# Patient Record
Sex: Male | Born: 1995 | Race: Black or African American | Hispanic: No | Marital: Single
Health system: Southern US, Community
[De-identification: ages and names within clinical notes are randomized; demographics above are authoritative.]

## PROBLEM LIST (undated history)

## (undated) HISTORY — PX: APPENDECTOMY: SHX54

---
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
## 2017-08-15 ENCOUNTER — Encounter (HOSPITAL_COMMUNITY): Payer: Self-pay | Admitting: Family Medicine

## 2017-08-15 ENCOUNTER — Emergency Department (HOSPITAL_COMMUNITY)
Admission: EM | Admit: 2017-08-15 | Discharge: 2017-08-15 | Disposition: A | Discharge status: Home / Self Care | Payer: No Typology Code available for payment source | Attending: Emergency Medicine | Admitting: Emergency Medicine

## 2017-08-15 DIAGNOSIS — S80211A Abrasion, right knee, initial encounter: Secondary | ICD-10-CM | POA: Diagnosis not present

## 2017-08-15 DIAGNOSIS — Y999 Unspecified external cause status: Secondary | ICD-10-CM | POA: Diagnosis not present

## 2017-08-15 DIAGNOSIS — M25511 Pain in right shoulder: Secondary | ICD-10-CM | POA: Insufficient documentation

## 2017-08-15 DIAGNOSIS — M25561 Pain in right knee: Secondary | ICD-10-CM | POA: Insufficient documentation

## 2017-08-15 DIAGNOSIS — Y929 Unspecified place or not applicable: Secondary | ICD-10-CM | POA: Insufficient documentation

## 2017-08-15 DIAGNOSIS — Y939 Activity, unspecified: Secondary | ICD-10-CM | POA: Insufficient documentation

## 2017-08-15 DIAGNOSIS — Z041 Encounter for examination and observation following transport accident: Secondary | ICD-10-CM | POA: Diagnosis present

## 2017-08-15 DIAGNOSIS — F17210 Nicotine dependence, cigarettes, uncomplicated: Secondary | ICD-10-CM | POA: Insufficient documentation

## 2017-08-15 MED ORDER — METHOCARBAMOL 500 MG PO TABS: 500.0000 mg | ORAL_TABLET | Freq: Two times a day (BID) | ORAL | 0 refills | Status: AC
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
## 2017-08-15 NOTE — Discharge Instructions (Signed)
The pain your experiencing is likely due to muscle strain, you may take Ibuprofen and Robaxin as needed for pain management. Do not combine with any pain reliever other than tylenol. The muscle soreness should improve over the next week. Follow up with your family doctor in the next week for a recheck if you are still having symptoms. Return to ED if pain is worsening, you develop weakness or numbness of extremities, or new or concerning symptoms develop. °

## 2017-08-15 NOTE — ED Notes (Signed)
No answer when called for triage 

## 2017-08-15 NOTE — ED Notes (Signed)
No answer when called 
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
## 2018-04-08 ENCOUNTER — Emergency Department (HOSPITAL_COMMUNITY)
Admission: EM | Admit: 2018-04-08 | Discharge: 2018-04-08 | Disposition: A | Discharge status: Home / Self Care | Payer: Self-pay | Attending: Emergency Medicine | Admitting: Emergency Medicine

## 2018-04-08 ENCOUNTER — Other Ambulatory Visit: Payer: Self-pay

## 2018-04-08 ENCOUNTER — Encounter (HOSPITAL_COMMUNITY): Payer: Self-pay

## 2018-04-08 DIAGNOSIS — Z7982 Long term (current) use of aspirin: Secondary | ICD-10-CM | POA: Insufficient documentation

## 2018-04-08 DIAGNOSIS — R369 Urethral discharge, unspecified: Secondary | ICD-10-CM | POA: Insufficient documentation

## 2018-04-08 DIAGNOSIS — Z79899 Other long term (current) drug therapy: Secondary | ICD-10-CM | POA: Insufficient documentation

## 2018-04-08 DIAGNOSIS — Z202 Contact with and (suspected) exposure to infections with a predominantly sexual mode of transmission: Secondary | ICD-10-CM | POA: Insufficient documentation

## 2018-04-08 DIAGNOSIS — F17210 Nicotine dependence, cigarettes, uncomplicated: Secondary | ICD-10-CM | POA: Insufficient documentation

## 2018-04-08 MED ORDER — LIDOCAINE HCL (PF) 1 % IJ SOLN
INTRAMUSCULAR | Status: AC
  Administered 2018-04-08: 1 mL
  Filled 2018-04-08: qty 5

## 2018-04-08 MED ORDER — CEFTRIAXONE SODIUM 250 MG IJ SOLR
250.0000 mg | Freq: Once | INTRAMUSCULAR | Status: AC
  Administered 2018-04-08: 250 mg via INTRAMUSCULAR
  Filled 2018-04-08: qty 250

## 2018-04-08 MED ORDER — AZITHROMYCIN 250 MG PO TABS
1000.0000 mg | ORAL_TABLET | Freq: Once | ORAL | Status: AC
  Administered 2018-04-08: 1000 mg via ORAL
  Filled 2018-04-08: qty 4

## 2018-04-08 NOTE — ED Provider Notes (Signed)
[facility] EMERGENCY DEPARTMENT Provider Note   CSN: [id number] Arrival date & time: 04/08/18  1032     History   Chief Complaint Chief Complaint  Patient presents with  . Exposure to STD    HPI Jason Dodson is a 22 y.o. male.  The history is provided by the patient and medical records. No language interpreter was used.  Exposure to STD  Pertinent negatives include no abdominal pain.   Jason Dodson is a 22 y.o. male who presents the emergency department for exposure to STD.  Patient states that his girlfriend notified him that she tested positive for gonorrhea and thinks that he gave it to her.  He does endorse white penile discharge for couple of days.  Denies any fever, abdominal pain, nausea, vomiting.   History reviewed. No pertinent past medical history.  There are no active problems to display for this patient.   Past Surgical History:  Procedure Laterality Date  . APPENDECTOMY          Home Medications    Prior to Admission medications   Medication Sig Start Date End Date Taking? Authorizing Provider  acetaminophen (TYLENOL) 500 MG tablet Take 1,000 mg by mouth every 4 (four) hours as needed for moderate pain.    [provider]  Aspirin-Acetaminophen-Caffeine (GOODY HEADACHE PO) Take 1 each by mouth daily as needed (headache).    [provider]  Hyprom-Naphaz-Polysorb-Zn Sulf (CLEAR EYES COMPLETE OP) Apply 3 drops to eye daily as needed (dry eyes.).    [provider]  ibuprofen (ADVIL,MOTRIN) 800 MG tablet Take 1 tablet (800 mg total) by mouth 3 (three) times daily. 03/29/14   Gilda Crease, MD  methocarbamol (ROBAXIN) 500 MG tablet Take 1 tablet (500 mg total) by mouth 2 (two) times daily. 08/15/17   Dartha Lodge, PA-C    Family History History reviewed. No pertinent family history.  Social History Social History   Tobacco Use  . Smoking status: Current Every Day Smoker    Packs/day: 0.10   Types: Cigarettes  . Smokeless tobacco: Never Used  Substance Use Topics  . Alcohol use: No  . Drug use: No    Types: Marijuana    Comment: Prior history but dening today.      Allergies   Patient has no known allergies.   Review of Systems Review of Systems  Constitutional: Negative for fever.  Gastrointestinal: Negative for abdominal pain, nausea and vomiting.  Genitourinary: Positive for discharge. Negative for difficulty urinating, dysuria, penile pain, scrotal swelling and testicular pain.     Physical Exam Updated Vital Signs BP (!) 159/81 (BP Location: Right Arm)   Pulse 79   Temp 97.8 F (36.6 C) (Oral)   Resp 18   Ht 6' (1.829 m)   Wt 104.3 kg   SpO2 98%   BMI 31.19 kg/m   Physical Exam  Constitutional: He appears well-developed and well-nourished. No distress.  HENT:  Head: Normocephalic and atraumatic.  Neck: Neck supple.  Cardiovascular: Normal rate, regular rhythm and normal heart sounds.  No murmur heard. Pulmonary/Chest: Effort normal and breath sounds normal. No respiratory distress. He has no wheezes. He has no rales.  Abdominal: Soft. He exhibits no distension. There is no tenderness.  Genitourinary:  Genitourinary Comments: Chaperone present for exam. + discharge from penis. No signs of lesion or erythema on the penis or testicles. The penis and testicles are nontender. No testicular masses or swelling. No signs of any inguinal hernias. Cremaster  reflex present bilaterally.  Musculoskeletal: Normal range of motion.  Neurological: He is alert.  Skin: Skin is warm and dry.  Nursing note and vitals reviewed.    ED Treatments / Results  Labs (all labs ordered are listed, but only abnormal results are displayed) Labs Reviewed  GC/CHLAMYDIA PROBE AMP ([place]) NOT AT [facility]    EKG None  Radiology No results found.  Procedures Procedures (including critical care time)  Medications Ordered in ED Medications  cefTRIAXone (ROCEPHIN)  injection 250 mg (250 mg Intramuscular Given 04/08/18 1137)  azithromycin (ZITHROMAX) tablet 1,000 mg (1,000 mg Oral Given 04/08/18 1131)  lidocaine (PF) (XYLOCAINE) 1 % injection (1 mL  Given 04/08/18 1137)     Initial Impression / Assessment and Plan / ED Course  I have reviewed the triage vital signs and the nursing notes.  Pertinent labs & imaging results that were available during my care of the patient were reviewed by me and considered in my medical decision making (see chart for details).     Patient is a 22 y.o. male who presents to ED for penile discharge. He is afebrile without abdominal tenderness, abdominal pain or painful bowel movements to indicate prostatitis.  No tenderness to palpation of the testes or epididymis to suggest orchitis or epididymitis.  STD cultures obtained including gonorrhea and chlamydia. Patient to be discharged with instructions to follow up with health department. Discussed importance of using protection when sexually active. Patient understands that they have GC/Chlamydia cultures pending and that they will need to inform all sexual partners if results return positive. Patient has been treated prophylactically with azithromycin and Rocephin. Return precautions given and all questions answered.    Final Clinical Impressions(s) / ED Diagnoses   Final diagnoses:  STD exposure  Penile discharge    ED Discharge Orders    None       Alize Borrayo, Chase Picket, PA-C 04/08/18 1155    Eber Hong, MD 04/09/18 [phone]

## 2018-04-08 NOTE — ED Notes (Signed)
Patient verbalizes understanding of discharge instructions. Opportunity for questioning and answers were provided. Armband removed by staff, pt discharged from ED.  

## 2018-04-08 NOTE — Discharge Instructions (Signed)
Use a condom with every sexual encounter Follow up with the health department regards to today's visit.   Please return to the ER for fevers, vomiting, new or worsening symptoms, any additional concerns.   You have been tested for chlamydia and gonorrhea. These results will be available in approximately 3 days. You will be notified if they are positive.

## 2018-04-08 NOTE — ED Triage Notes (Signed)
Pt states a sexual partner told him she had gonorrhea. Pt states he has been having white discharge X2 days. No abdomiknal tenderness.

## 2018-04-09 LAB — GC/CHLAMYDIA PROBE AMP (~~LOC~~) NOT AT ARMC
Chlamydia: NEGATIVE
Neisseria Gonorrhea: POSITIVE — AB

## 2018-06-04 ENCOUNTER — Encounter (HOSPITAL_COMMUNITY): Payer: Self-pay

## 2018-06-04 ENCOUNTER — Emergency Department (HOSPITAL_COMMUNITY)
Admission: EM | Admit: 2018-06-04 | Discharge: 2018-06-04 | Disposition: A | Discharge status: Home / Self Care | Payer: Self-pay | Attending: Emergency Medicine | Admitting: Emergency Medicine

## 2018-06-04 DIAGNOSIS — R369 Urethral discharge, unspecified: Secondary | ICD-10-CM | POA: Insufficient documentation

## 2018-06-04 DIAGNOSIS — Z79899 Other long term (current) drug therapy: Secondary | ICD-10-CM | POA: Insufficient documentation

## 2018-06-04 DIAGNOSIS — R350 Frequency of micturition: Secondary | ICD-10-CM | POA: Insufficient documentation

## 2018-06-04 DIAGNOSIS — Z711 Person with feared health complaint in whom no diagnosis is made: Secondary | ICD-10-CM

## 2018-06-04 DIAGNOSIS — R300 Dysuria: Secondary | ICD-10-CM | POA: Insufficient documentation

## 2018-06-04 DIAGNOSIS — Z202 Contact with and (suspected) exposure to infections with a predominantly sexual mode of transmission: Secondary | ICD-10-CM | POA: Insufficient documentation

## 2018-06-04 DIAGNOSIS — F17210 Nicotine dependence, cigarettes, uncomplicated: Secondary | ICD-10-CM | POA: Insufficient documentation

## 2018-06-04 LAB — URINALYSIS, ROUTINE W REFLEX MICROSCOPIC
Bilirubin Urine: NEGATIVE
GLUCOSE, UA: NEGATIVE mg/dL
Hgb urine dipstick: NEGATIVE
Ketones, ur: NEGATIVE mg/dL
Leukocytes, UA: NEGATIVE
Nitrite: NEGATIVE
PROTEIN: NEGATIVE mg/dL
Specific Gravity, Urine: 1.025 (ref 1.005–1.030)
pH: 6 (ref 5.0–8.0)

## 2018-06-04 MED ORDER — LIDOCAINE HCL (PF) 1 % IJ SOLN
INTRAMUSCULAR | Status: AC
  Administered 2018-06-04: 0.9 mL
  Filled 2018-06-04: qty 5

## 2018-06-04 MED ORDER — CEFTRIAXONE SODIUM 250 MG IJ SOLR
250.0000 mg | Freq: Once | INTRAMUSCULAR | Status: AC
  Administered 2018-06-04: 250 mg via INTRAMUSCULAR
  Filled 2018-06-04: qty 250

## 2018-06-04 MED ORDER — AZITHROMYCIN 250 MG PO TABS
1000.0000 mg | ORAL_TABLET | Freq: Once | ORAL | Status: AC
  Administered 2018-06-04: 1000 mg via ORAL
  Filled 2018-06-04: qty 4

## 2018-06-04 NOTE — Discharge Instructions (Signed)
Follow up with [facility] Department STD clinic to be screened for HIV in the future and for future STD concerns or screenings. This is the recommendation by the CDC for people with multiple sexual partners or hx of STDs. You have been treated for gonorrhea and chlamydia in the ER but we did not get any samples to test for gonorrhea, chlamydia, syphilis, or HIV.  Do not have sexual intercourse for 7 days after antibiotic treatment.  Return to the emergency department if any concerning signs or symptoms develop such as fevers, persistent vomiting, pain with bowel movements, or abdominal pain.

## 2018-06-04 NOTE — ED Triage Notes (Signed)
Pt reports penile discharge for the past 3 days and penile irritation. Significant other having similar symptoms

## 2018-06-04 NOTE — ED Provider Notes (Signed)
[facility]   CSN: [id number] Arrival date & time: 06/04/18  1826     History   Chief Complaint Chief Complaint  Patient presents with  . SEXUALLY TRANSMITTED DISEASE    HPI Jason Dodson is a 23 y.o. male presents for evaluation of acute onset, persistent urethral discharge, dysuria, urinary frequency for 2 days.  He also notes some irritation to the tip of his penis.  Denies genital lesions, hematuria, abdominal pain, nausea, vomiting, pain with bowel movements, fevers, testicular pain or scrotal swelling.  Has not tried anything for his symptoms.  Reports he was recently treated for gonorrhea and thinks he may have a recurrence in his symptoms.  He has had new partners since he was last tested and has not always use protection.  He declines HIV or syphilis testing today.  His symptoms did resolve entirely until recurrence of symptoms 2 days ago.  The history is provided by the patient.    History reviewed. No pertinent past medical history.  There are no active problems to display for this patient.   Past Surgical History:  Procedure Laterality Date  . APPENDECTOMY          Home Medications    Prior to Admission medications   Medication Sig Start Date End Date Taking? Authorizing Provider  acetaminophen (TYLENOL) 500 MG tablet Take 1,000 mg by mouth every 4 (four) hours as needed for moderate pain.    [provider]  Aspirin-Acetaminophen-Caffeine (GOODY HEADACHE PO) Take 1 each by mouth daily as needed (headache).    [provider]  Hyprom-Naphaz-Polysorb-Zn Sulf (CLEAR EYES COMPLETE OP) Apply 3 drops to eye daily as needed (dry eyes.).    [provider]  ibuprofen (ADVIL,MOTRIN) 800 MG tablet Take 1 tablet (800 mg total) by mouth 3 (three) times daily. 03/29/14   Gilda CreasePollina, Christopher J, MD  methocarbamol (ROBAXIN) 500 MG tablet Take 1 tablet (500 mg total) by mouth 2 (two) times daily.  08/15/17   Dartha LodgeFord, Kelsey N, PA-C    Family History No family history on file.  Social History Social History   Tobacco Use  . Smoking status: Current Every Day Smoker    Packs/day: 0.10    Types: Cigarettes  . Smokeless tobacco: Never Used  Substance Use Topics  . Alcohol use: No  . Drug use: No    Types: Marijuana    Comment: Prior history but dening today.      Allergies   Patient has no known allergies.   Review of Systems Review of Systems  Constitutional: Negative for fever.  Gastrointestinal: Negative for abdominal pain, diarrhea, nausea, rectal pain and vomiting.  Genitourinary: Positive for discharge, dysuria and frequency. Negative for hematuria, penile pain, penile swelling, scrotal swelling and testicular pain.     Physical Exam Updated Vital Signs BP 133/75 (BP Location: Right Arm)   Pulse 67   Temp 97.7 F (36.5 C) (Oral)   Resp 14   SpO2 99%   Physical Exam Vitals signs and nursing note reviewed.  Constitutional:      General: He is not in acute distress.    Appearance: He is well-developed.     Comments: Resting comfortably in bed  HENT:     Head: Normocephalic and atraumatic.  Eyes:     General:        Right eye: No discharge.        Left eye: No discharge.     Conjunctiva/sclera: Conjunctivae normal.  Neck:     Vascular: No JVD.     Trachea: No tracheal deviation.  Cardiovascular:     Rate and Rhythm: Normal rate.     Heart sounds: Normal heart sounds.  Pulmonary:     Effort: Pulmonary effort is normal.     Breath sounds: Normal breath sounds.  Abdominal:     General: Abdomen is flat. There is no distension.     Tenderness: There is no abdominal tenderness. There is no right CVA tenderness, left CVA tenderness, guarding or rebound.  Genitourinary:    Comments: Patient refused Skin:    General: Skin is warm and dry.     Findings: No erythema.  Neurological:     Mental Status: He is alert.  Psychiatric:        Behavior: Behavior  normal.      ED Treatments / Results  Labs (all labs ordered are listed, but only abnormal results are displayed) Labs Reviewed  URINALYSIS, ROUTINE W REFLEX MICROSCOPIC  GC/CHLAMYDIA PROBE AMP ([place]) NOT AT [facility]    EKG None  Radiology No results found.  Procedures Procedures (including critical care time)  Medications Ordered in ED Medications  cefTRIAXone (ROCEPHIN) injection 250 mg (has no administration in time range)  azithromycin (ZITHROMAX) tablet 1,000 mg (has no administration in time range)     Initial Impression / Assessment and Plan / ED Course  I have reviewed the triage vital signs and the nursing notes.  Pertinent labs & imaging results that were available during my care of the patient were reviewed by me and considered in my medical decision making (see chart for details).     Patient presenting with urinary symptoms and urethral discharge.  He is afebrile, vital signs are stable.  Patient is afebrile without abdominal tenderness, abdominal pain or painful bowel movements to indicate prostatitis.  He refused GU examination or STD cultures so I am unable to assess for epididymitis or orchitis.  Based on history I have a low suspicion of testicular torsion.  His UA is unremarkable.  Patient to be discharged with instructions to follow up with PCP or the health department. Discussed importance of using protection when sexually active. Patient has been treated prophylactically with azithromycin and Rocephin.  Discussed strict ED return precautions. Pt verbalized understanding of and agreement with plan and is safe for discharge home at this time.     Final Clinical Impressions(s) / ED Diagnoses   Final diagnoses:  Concern about STD in male without diagnosis    ED Discharge Orders    None       [name] 06/04/18 2041    Gerhard Munch, MD 06/04/18 [phone]

## 2018-06-05 LAB — GC/CHLAMYDIA PROBE AMP (~~LOC~~) NOT AT ARMC

## 2018-11-28 ENCOUNTER — Encounter (HOSPITAL_COMMUNITY): Payer: Self-pay | Admitting: Emergency Medicine

## 2018-11-28 ENCOUNTER — Emergency Department (HOSPITAL_COMMUNITY)
Admission: EM | Admit: 2018-11-28 | Discharge: 2018-11-28 | Disposition: A | Discharge status: Home / Self Care | Payer: Self-pay | Attending: Emergency Medicine | Admitting: Emergency Medicine

## 2018-11-28 ENCOUNTER — Other Ambulatory Visit: Payer: Self-pay

## 2018-11-28 DIAGNOSIS — Z202 Contact with and (suspected) exposure to infections with a predominantly sexual mode of transmission: Secondary | ICD-10-CM | POA: Insufficient documentation

## 2018-11-28 DIAGNOSIS — Z711 Person with feared health complaint in whom no diagnosis is made: Secondary | ICD-10-CM

## 2018-11-28 DIAGNOSIS — Z79899 Other long term (current) drug therapy: Secondary | ICD-10-CM | POA: Insufficient documentation

## 2018-11-28 DIAGNOSIS — F17210 Nicotine dependence, cigarettes, uncomplicated: Secondary | ICD-10-CM | POA: Insufficient documentation

## 2018-11-28 LAB — URINALYSIS, ROUTINE W REFLEX MICROSCOPIC
Bilirubin Urine: NEGATIVE
Glucose, UA: NEGATIVE mg/dL
Hgb urine dipstick: NEGATIVE
Ketones, ur: NEGATIVE mg/dL
Leukocytes,Ua: NEGATIVE
Nitrite: NEGATIVE
Protein, ur: NEGATIVE mg/dL
Specific Gravity, Urine: 1.024 (ref 1.005–1.030)
pH: 6 (ref 5.0–8.0)

## 2018-11-28 NOTE — ED Provider Notes (Signed)
[facility]-EMERGENCY DEPT Provider Note   CSN: [id number] Arrival date & time: 11/28/18  1916    History   Chief Complaint Chief Complaint  Patient presents with  . Exposure to STD    HPI Jason Dodson is a 23 y.o. male with a PMH of previous STIs presents with concerns about a possible STD exposure. Patient reports clear penile discharge starting today. Patient reports urinary discomfort and urinary frequency. Patient reports he has had 4 new sexual partners within the last few months and does not consistently use protection. Patient denies any genital lesions, trouble with bowel movements, penile pain, testicular edema, or testicular pain. Patient denies fever, chills, nausea, vomiting, or abdominal pain.      HPI  History reviewed. No pertinent past medical history.  There are no active problems to display for this patient.   Past Surgical History:  Procedure Laterality Date  . APPENDECTOMY          Home Medications    Prior to Admission medications   Medication Sig Start Date End Date Taking? Authorizing Provider  acetaminophen (TYLENOL) 500 MG tablet Take 1,000 mg by mouth every 4 (four) hours as needed for moderate pain.    [provider]  Aspirin-Acetaminophen-Caffeine (GOODY HEADACHE PO) Take 1 each by mouth daily as needed (headache).    [provider]  Hyprom-Naphaz-Polysorb-Zn Sulf (CLEAR EYES COMPLETE OP) Apply 3 drops to eye daily as needed (dry eyes.).    [provider]  ibuprofen (ADVIL,MOTRIN) 800 MG tablet Take 1 tablet (800 mg total) by mouth 3 (three) times daily. 03/29/14   Gilda CreasePollina, Christopher J, MD  methocarbamol (ROBAXIN) 500 MG tablet Take 1 tablet (500 mg total) by mouth 2 (two) times daily. 08/15/17   Dartha LodgeFord, Kelsey N, PA-C    Family History History reviewed. No pertinent family history.  Social History Social History   Tobacco Use  . Smoking status: Current Every Day Smoker    Packs/day: 0.10     Types: Cigarettes  . Smokeless tobacco: Never Used  Substance Use Topics  . Alcohol use: No  . Drug use: No    Types: Marijuana    Comment: Prior history but dening today.      Allergies   Patient has no known allergies.   Review of Systems Review of Systems  Constitutional: Negative for activity change, appetite change, chills and fever.  Respiratory: Negative for shortness of breath.   Gastrointestinal: Negative for abdominal pain, nausea and vomiting.  Genitourinary: Positive for discharge, dysuria and frequency. Negative for decreased urine volume, difficulty urinating, enuresis, flank pain, genital sores, hematuria, penile pain, penile swelling, scrotal swelling, testicular pain and urgency.  Musculoskeletal: Negative for back pain.  Skin: Negative for rash and wound.  Allergic/Immunologic: Negative for immunocompromised state.     Physical Exam Updated Vital Signs BP (!) 142/94 (BP Location: Left Arm)   Pulse 88   Temp 98.8 F (37.1 C) (Oral)   Resp 14   Ht 5\' 10"  (1.778 m)   Wt 107 kg   SpO2 100%   BMI 33.86 kg/m   Physical Exam Vitals signs and nursing note reviewed. Exam conducted with a chaperone present.  Constitutional:      General: He is not in acute distress.    Appearance: He is well-developed. He is not diaphoretic.  HENT:     Head: Normocephalic and atraumatic.  Neck:     Musculoskeletal: Normal range of motion.  Cardiovascular:     Rate  and Rhythm: Normal rate and regular rhythm.     Heart sounds: Normal heart sounds. No murmur. No friction rub. No gallop.   Pulmonary:     Effort: Pulmonary effort is normal. No respiratory distress.     Breath sounds: Normal breath sounds. No wheezing or rales.  Abdominal:     Palpations: Abdomen is soft.     Tenderness: There is no abdominal tenderness.  Genitourinary:    Penis: Uncircumcised. Discharge (Clear discharge noted.) present. No phimosis, paraphimosis, hypospadias, erythema, tenderness,  swelling or lesions.      Scrotum/Testes: Normal. Cremasteric reflex is present.        Right: Mass, tenderness, swelling, testicular hydrocele or varicocele not present. Right testis is descended. Cremasteric reflex is present.         Left: Mass, tenderness, swelling, testicular hydrocele or varicocele not present. Left testis is descended. Cremasteric reflex is present.      Epididymis:     Right: Normal.     Left: Normal.  Musculoskeletal: Normal range of motion.  Skin:    General: Skin is warm.     Findings: No erythema or rash.  Neurological:     Mental Status: He is alert.      ED Treatments / Results  Labs (all labs ordered are listed, but only abnormal results are displayed) Labs Reviewed  URINALYSIS, ROUTINE W REFLEX MICROSCOPIC  GC/CHLAMYDIA PROBE AMP ([place]) NOT AT [facility]    EKG None  Radiology No results found.  Procedures Procedures (including critical care time)  Medications Ordered in ED Medications - No data to display   Initial Impression / Assessment and Plan / ED Course  I have reviewed the triage vital signs and the nursing notes.  Pertinent labs & imaging results that were available during my care of the patient were reviewed by me and considered in my medical decision making (see chart for details).       Patient is afebrile without abdominal tenderness, abdominal pain or painful bowel movements to indicate prostatitis.  No tenderness to palpation of the testes or epididymis to suggest orchitis or epididymitis.  UA is negative. STD cultures obtained including gonorrhea and chlamydia. Patient requested not to be tested for HIV or syphilis at this time. Patient to be discharged with instructions to follow up with PCP. Discussed importance of using protection when sexually active. Pt understands that they have GC/Chlamydia cultures pending and that they will need to inform all sexual partners if results return positive. Patient did not wish to be  treated prophylactically with azithromycin and Rocephin at this time. Patient states he will receive treatment if needed when results are available. Patient is stable in no acute distress. Patient will be discharged home. Patient states he understands and agrees with plan.  Final Clinical Impressions(s) / ED Diagnoses   Final diagnoses:  Concern about STD in male without diagnosis    ED Discharge Orders    None       Julienne Kass 11/28/18 2046    Milton Ferguson, MD 12/01/18 [phone]

## 2018-11-28 NOTE — ED Triage Notes (Signed)
Pt reports potentially being exposed to STD and has been feeling a discomfort when urinating and clear discharge after intercourse.

## 2018-11-28 NOTE — Discharge Instructions (Addendum)
You have been seen today for concerns about STD exposure. Your chlamydia and gonorrhea results are pending. You will receive a call if results are positive. Please inform your partners if your results are positive. Please read and follow all provided instructions.   1. Medications: usual home medications 2. Treatment: rest, drink plenty of fluids 3. Follow Up: Please follow up with your primary doctor in 7 days for discussion of your diagnoses and further evaluation after today's visit; if you do not have a primary care doctor use the resource guide provided to find one; Please return to the ER for any new or worsening symptoms. Please obtain all of your results from medical records or have your doctors office obtain the results - share them with your doctor - you should be seen at your doctors office. Call today to arrange your follow up.  ?  You should return to the ER if you develop severe or worsening symptoms.   Emergency Department Resource Guide 1) Find a Doctor and Pay Out of Pocket Although you won't have to find out who is covered by your insurance plan, it is a good idea to ask around and get recommendations. You will then need to call the office and see if the doctor you have chosen will accept you as a new patient and what types of options they offer for patients who are self-pay. Some doctors offer discounts or will set up payment plans for their patients who do not have insurance, but you will need to ask so you aren't surprised when you get to your appointment.  2) Contact Your Local Health Department Not all health departments have doctors that can see patients for sick visits, but many do, so it is worth a call to see if yours does. If you don't know where your local health department is, you can check in your phone book. The CDC also has a tool to help you locate your state's health department, and many state websites also have listings of all of their local health departments.  3)  Find a [facility] If your illness is not likely to be very severe or complicated, you may want to try a walk in clinic. These are popping up all over the country in pharmacies, drugstores, and shopping centers. They're usually staffed by nurse practitioners or physician assistants that have been trained to treat common illnesses and complaints. They're usually fairly quick and inexpensive. However, if you have serious medical issues or chronic medical problems, these are probably not your best option.  No Primary Care Doctor: Call Health Connect at  [phone] - they can help you locate a primary care doctor that  accepts your insurance, provides certain services, etc. Physician Referral Service- [phone]  Chronic Pain Problems: Organization         Address  Phone   Notes  [facility]  [phone] Patients need to be referred by their primary care doctor.   Medication Assistance: Organization         Address  Phone   Notes  [facility] Medication [facility] [address] [place], [place] [address] [phone] --Must be a resident of [facility] -- Must have NO insurance coverage whatsoever (no Medicaid/ Medicare, etc.) -- The pt. MUST have a primary care doctor that directs their care regularly and follows them in the community   [facility]  [phone]   [employer]  [phone]    Agencies that  provide inexpensive medical care: Organization         Address  Phone   Notes  [facility]  [phone]   [facility]    [phone]) [phone]   [facility] [address] [place], [place] [address] [phone]) [phone]   [facility] [address], [place] [phone]) [phone]   [facility]    [phone]) [phone]   [facility]    [phone]) [phone]   [facility]  [address], [place] Phone:  [phone]) [phone], Fax:  [phone])  [phone] Hours of Operation:  9 am - 6 pm, M-F.  Also accepts Medicaid/Medicare and self-pay.  [facility]  [address], [place] Phone: [phone](336) [phone], Fax: [phone]. Hours of Operation:  8:30 am - 5:30 pm, M-F.  Also accepts Medicaid and self-pay.  [facility] [place] [address], [place] Phone: [phone]) [phone]   [facility] [address], [place], [place] [phone], Ext. [phone] Mondays & Thursdays: 7-9 AM.  First 15 patients are seen on a first come, first serve basis.    Medicaid-accepting [facility] Providers:  Organization         Address  Phone   Notes  [facility] [address], [place] [phone]) [phone] Also accepts self-pay patients.  [facility] [address], [place]  [phone]) [phone]   [facility] [address], [place] [phone]) [phone]   [facility] [address] [place] [address], [place] [phone]) [phone]   Renaye RakersVeita Bland [address], [place]   [phone] Only accepts [place] patients after they have their name applied to their card.   Self-Pay (no insurance) in [facility]:  Organization         Address  Phone   Notes  Sickle Cell Patients, [facility] [address], [place] [phone]) [phone]   [facility] [address], [place] [phone]   [facility]  [address], [address] [phone]   [facility]/Dr. Osei-Bonsu  [address] [place] [address], [place] or [id number], [place] [phone] Phone number for both [place] and [place] locations is the same.  [facility] [address], [place] [phone]) [phone]   [facility] [place] [address] [place] [address], [place] or [address] [phone]     [facility] [address], [place] [phone]) [phone], phone; [phone], fax Sees patients 1st and 3rd Saturday of every month.  Must not qualify for public or private insurance (i.e. Medicaid, Medicare, [place] Health Choice, Veterans' Benefits)  Household income should be no more than 200% of the poverty level The clinic cannot treat you if you are pregnant or think you are pregnant  Sexually transmitted diseases are not treated at the clinic.

## 2018-12-03 LAB — GC/CHLAMYDIA PROBE AMP (~~LOC~~) NOT AT ARMC
Chlamydia: NEGATIVE
Neisseria Gonorrhea: NEGATIVE

## 2019-02-01 ENCOUNTER — Other Ambulatory Visit: Payer: Self-pay

## 2019-02-01 ENCOUNTER — Encounter (HOSPITAL_COMMUNITY): Payer: Self-pay | Admitting: *Deleted

## 2019-02-01 ENCOUNTER — Emergency Department (HOSPITAL_COMMUNITY)
Admission: EM | Admit: 2019-02-01 | Discharge: 2019-02-02 | Disposition: A | Discharge status: Home / Self Care | Payer: Self-pay | Attending: Emergency Medicine | Admitting: Emergency Medicine

## 2019-02-01 DIAGNOSIS — F17210 Nicotine dependence, cigarettes, uncomplicated: Secondary | ICD-10-CM | POA: Insufficient documentation

## 2019-02-01 DIAGNOSIS — Z79899 Other long term (current) drug therapy: Secondary | ICD-10-CM | POA: Insufficient documentation

## 2019-02-01 DIAGNOSIS — K2920 Alcoholic gastritis without bleeding: Secondary | ICD-10-CM | POA: Insufficient documentation

## 2019-02-01 DIAGNOSIS — F1290 Cannabis use, unspecified, uncomplicated: Secondary | ICD-10-CM | POA: Insufficient documentation

## 2019-02-01 LAB — CBC WITH DIFFERENTIAL/PLATELET
Abs Immature Granulocytes: 0.08 10*3/uL — ABNORMAL HIGH (ref 0.00–0.07)
Basophils Absolute: 0 10*3/uL (ref 0.0–0.1)
Basophils Relative: 0 %
Eosinophils Absolute: 0 10*3/uL (ref 0.0–0.5)
Eosinophils Relative: 0 %
HCT: 49.3 % (ref 39.0–52.0)
Hemoglobin: 16.9 g/dL (ref 13.0–17.0)
Immature Granulocytes: 1 %
Lymphocytes Relative: 10 %
Lymphs Abs: 1.5 10*3/uL (ref 0.7–4.0)
MCH: 30.1 pg (ref 26.0–34.0)
MCHC: 34.3 g/dL (ref 30.0–36.0)
MCV: 87.7 fL (ref 80.0–100.0)
Monocytes Absolute: 1 10*3/uL (ref 0.1–1.0)
Monocytes Relative: 7 %
Neutro Abs: 12.2 10*3/uL — ABNORMAL HIGH (ref 1.7–7.7)
Neutrophils Relative %: 82 %
Platelets: 227 10*3/uL (ref 150–400)
RBC: 5.62 MIL/uL (ref 4.22–5.81)
RDW: 12.7 % (ref 11.5–15.5)
WBC: 14.8 10*3/uL — ABNORMAL HIGH (ref 4.0–10.5)
nRBC: 0 % (ref 0.0–0.2)

## 2019-02-01 LAB — COMPREHENSIVE METABOLIC PANEL
ALT: 22 U/L (ref 0–44)
AST: 25 U/L (ref 15–41)
Albumin: 5.4 g/dL — ABNORMAL HIGH (ref 3.5–5.0)
Alkaline Phosphatase: 60 U/L (ref 38–126)
Anion gap: 11 (ref 5–15)
BUN: 14 mg/dL (ref 6–20)
CO2: 24 mmol/L (ref 22–32)
Calcium: 10.4 mg/dL — ABNORMAL HIGH (ref 8.9–10.3)
Chloride: 106 mmol/L (ref 98–111)
Creatinine, Ser: 1.07 mg/dL (ref 0.61–1.24)
GFR calc Af Amer: >60 mL/min (ref >60)
GFR calc non Af Amer: >60 mL/min (ref >60)
Glucose, Bld: 126 mg/dL — ABNORMAL HIGH (ref 70–99)
Potassium: 3.6 mmol/L (ref 3.5–5.1)
Sodium: 141 mmol/L (ref 135–145)
Total Bilirubin: 1.4 mg/dL — ABNORMAL HIGH (ref 0.3–1.2)
Total Protein: 9.2 g/dL — ABNORMAL HIGH (ref 6.5–8.1)

## 2019-02-01 LAB — URINALYSIS, ROUTINE W REFLEX MICROSCOPIC
Bacteria, UA: NONE SEEN
Bilirubin Urine: NEGATIVE
Glucose, UA: NEGATIVE mg/dL
Hgb urine dipstick: NEGATIVE
Ketones, ur: 20 mg/dL — AB
Leukocytes,Ua: NEGATIVE
Nitrite: NEGATIVE
Protein, ur: 100 mg/dL — AB
Specific Gravity, Urine: 1.03 (ref 1.005–1.030)
pH: 7 (ref 5.0–8.0)

## 2019-02-01 LAB — RAPID URINE DRUG SCREEN, HOSP PERFORMED
Amphetamines: NOT DETECTED
Barbiturates: NOT DETECTED
Benzodiazepines: NOT DETECTED
Cocaine: NOT DETECTED
Opiates: NOT DETECTED
Tetrahydrocannabinol: POSITIVE — AB

## 2019-02-01 LAB — LIPASE, BLOOD: Lipase: 15 U/L (ref 11–51)

## 2019-02-01 LAB — ETHANOL: Alcohol, Ethyl (B): <10 mg/dL (ref <10)

## 2019-02-01 MED ORDER — SODIUM CHLORIDE 0.9 % IV BOLUS
1000.0000 mL | Freq: Once | INTRAVENOUS | Status: AC
  Administered 2019-02-01: 1000 mL via INTRAVENOUS

## 2019-02-01 MED ORDER — SODIUM CHLORIDE 0.9 % IV BOLUS
1000.0000 mL | Freq: Once | INTRAVENOUS | Status: AC
  Administered 2019-02-01: 22:00:00 1000 mL via INTRAVENOUS

## 2019-02-01 MED ORDER — FAMOTIDINE IN NACL 20-0.9 MG/50ML-% IV SOLN
20.0000 mg | Freq: Once | INTRAVENOUS | Status: AC
  Administered 2019-02-01: 20 mg via INTRAVENOUS
  Filled 2019-02-01: qty 50

## 2019-02-01 MED ORDER — ONDANSETRON HCL 4 MG/2ML IJ SOLN
4.0000 mg | Freq: Once | INTRAMUSCULAR | Status: AC
  Administered 2019-02-01: 4 mg via INTRAVENOUS
  Filled 2019-02-01: qty 2

## 2019-02-01 MED ORDER — PROMETHAZINE HCL 25 MG/ML IJ SOLN
12.5000 mg | Freq: Once | INTRAMUSCULAR | Status: AC
  Administered 2019-02-01: 12.5 mg via INTRAVENOUS
  Filled 2019-02-01: qty 1

## 2019-02-01 NOTE — ED Provider Notes (Signed)
[facility]   CSN: [id number] Arrival date & time: 02/01/19  1814     History   Chief Complaint Chief Complaint  Patient presents with  . Nausea  . Emesis    HPI Jason Dodson is a 23 y.o. male.     HPI Patient presented to ED for evaluation of nausea and vomiting.  Patient admits to drinking alcohol heavily last evening.  His last drink was sometime last night.  Patient states he started having trouble with nausea and vomiting throughout the day.  Initially thought he had a bad hangover but he has not been able to keep any fluids down.  Patient has been constantly vomiting and dry heaving.  He is feeling dehydrated.  He denies any abdominal pain.  He denies any hematemesis.  He denies any diarrhea.  No fevers or chills. History reviewed. No pertinent past medical history.  There are no active problems to display for this patient.   Past Surgical History:  Procedure Laterality Date  . APPENDECTOMY          Home Medications    Prior to Admission medications   Medication Sig Start Date End Date Taking? Authorizing Provider  acetaminophen (TYLENOL) 500 MG tablet Take 1,000 mg by mouth every 4 (four) hours as needed for moderate pain.    [provider]  Aspirin-Acetaminophen-Caffeine (GOODY HEADACHE PO) Take 1 each by mouth daily as needed (headache).    [provider]  Hyprom-Naphaz-Polysorb-Zn Sulf (CLEAR EYES COMPLETE OP) Apply 3 drops to eye daily as needed (dry eyes.).    [provider]  ibuprofen (ADVIL,MOTRIN) 800 MG tablet Take 1 tablet (800 mg total) by mouth 3 (three) times daily. 03/29/14   Gilda CreasePollina, Christopher J, MD  methocarbamol (ROBAXIN) 500 MG tablet Take 1 tablet (500 mg total) by mouth 2 (two) times daily. 08/15/17   Dartha LodgeFord, Kelsey N, PA-C    Family History No family history on file.  Social History Social History   Tobacco Use  . Smoking status: Current Every Day Smoker   Packs/day: 0.10    Types: Cigarettes  . Smokeless tobacco: Never Used  Substance Use Topics  . Alcohol use: Yes  . Drug use: No    Types: Marijuana    Comment: Prior history but dening today.      Allergies   Patient has no known allergies.   Review of Systems Review of Systems  All other systems reviewed and are negative.    Physical Exam Updated Vital Signs BP (!) 155/98 (BP Location: Right Arm)   Pulse 75   Temp 98.7 F (37.1 C) (Oral)   Resp (!) 21   Ht 1.778 m (5\' 10" )   Wt 106.6 kg   SpO2 100%   BMI 33.72 kg/m   Physical Exam Vitals signs and nursing note reviewed.  Constitutional:      General: He is not in acute distress.    Appearance: He is well-developed.  HENT:     Head: Normocephalic and atraumatic.     Right Ear: External ear normal.     Left Ear: External ear normal.  Eyes:     General: No scleral icterus.       Right eye: No discharge.        Left eye: No discharge.     Conjunctiva/sclera: Conjunctivae normal.  Neck:     Musculoskeletal: Neck supple.     Trachea: No tracheal deviation.  Cardiovascular:  Rate and Rhythm: Normal rate and regular rhythm.  Pulmonary:     Effort: Pulmonary effort is normal. No respiratory distress.     Breath sounds: Normal breath sounds. No stridor. No wheezing or rales.  Abdominal:     General: Bowel sounds are normal. There is no distension.     Palpations: Abdomen is soft.     Tenderness: There is no abdominal tenderness. There is no guarding or rebound.  Musculoskeletal:        General: No tenderness.  Skin:    General: Skin is warm and dry.     Findings: No rash.  Neurological:     Mental Status: He is alert.     Cranial Nerves: No cranial nerve deficit (no facial droop, extraocular movements intact, no slurred speech).     Sensory: No sensory deficit.     Motor: No abnormal muscle tone or seizure activity.     Coordination: Coordination normal.      ED Treatments / Results  Labs (all  labs ordered are listed, but only abnormal results are displayed) Labs Reviewed  CBC WITH DIFFERENTIAL/PLATELET - Abnormal; Notable for the following components:      Result Value   WBC 14.8 (*)    Neutro Abs 12.2 (*)    Abs Immature Granulocytes 0.08 (*)    All other components within normal limits  COMPREHENSIVE METABOLIC PANEL - Abnormal; Notable for the following components:   Glucose, Bld 126 (*)    Calcium 10.4 (*)    Total Protein 9.2 (*)    Albumin 5.4 (*)    Total Bilirubin 1.4 (*)    All other components within normal limits  LIPASE, BLOOD  ETHANOL    EKG None  Radiology No results found.  Procedures Procedures (including critical care time)  Medications Ordered in ED Medications  promethazine (PHENERGAN) injection 12.5 mg (has no administration in time range)  sodium chloride 0.9 % bolus 1,000 mL (has no administration in time range)  sodium chloride 0.9 % bolus 1,000 mL (1,000 mLs Intravenous Bolus from Bag 02/01/19 2055)  ondansetron (ZOFRAN) injection 4 mg (4 mg Intravenous Given 02/01/19 2049)  famotidine (PEPCID) IVPB 20 mg premix (20 mg Intravenous New Bag/Given 02/01/19 2053)     Initial Impression / Assessment and Plan / ED Course  I have reviewed the triage vital signs and the nursing notes.  Pertinent labs & imaging results that were available during my care of the patient were reviewed by me and considered in my medical decision making (see chart for details).  Clinical Course as of Jan 31 2149  Sat Feb 01, 2019  2143 Labs notable for hypercalcemia.  Slight increase in bilirubin.  Patient also has a leukocytosis.  LFTs and lipase are otherwise unremarkable.   [JK]  2149 Patient is still having some nausea.  I will give an additional liter of fluid and Phenergan   [JK]    Clinical Course User Index [JK] Linwood Dibbles, MD     Patient presented with nausea and vomiting after alcohol use.  No complaints of pain.  Doubt pancreatitis or hepatitis based on  exam or laboratory tests.  No vomiting in the ED but patient still nausea.  Will give additional liter of fluids and Phenergan.  Care turned over to oncoming team.  Anticipate discharge Final Clinical Impressions(s) / ED Diagnoses   Final diagnoses:  Acute alcoholic gastritis without hemorrhage    ED Discharge Orders    None  Dorie Rank, MD 02/01/19 2151

## 2019-02-01 NOTE — ED Notes (Signed)
Pt states he is feeling much better and is ready to go home.

## 2019-02-01 NOTE — ED Triage Notes (Signed)
Pt has vomited 4-5 times today. Pt states he drank too much alcohol last night. Pt has been unable to keep fluids down.

## 2019-02-01 NOTE — ED Notes (Signed)
Pt states he had too much alcohol to drink yesterday and the nausea will not go away nor has it lessened.

## 2019-02-02 MED ORDER — CAPSAICIN 0.1 % EX CREA: 1.0000 "application " | TOPICAL_CREAM | Freq: Four times a day (QID) | CUTANEOUS | 0 refills | Status: AC | PRN

## 2019-02-02 MED ORDER — ONDANSETRON 4 MG PO TBDP: ORAL_TABLET | ORAL | 0 refills | Status: AC

## 2019-02-02 NOTE — ED Provider Notes (Signed)
Care assumed from Dr. Dorie Rank.  Please see his full H&P.  In short,  Jason Dodson is a 23 y.o. male presents for nausea and vomiting.  He admits to drinking alcohol heavily last evening but was not vomiting yesterday.  He has had nausea and vomiting throughout the day today.  Patient denies abdominal pain, hematemesis, diarrhea.   Physical Exam  BP (!) 147/73   Pulse 98   Temp 98.7 F (37.1 C) (Oral)   Resp 16   Ht 5\' 10"  (1.778 m)   Wt 106.6 kg   SpO2 100%   BMI 33.72 kg/m   Physical Exam Vitals signs and nursing note reviewed.  Constitutional:      General: He is not in acute distress.    Appearance: He is well-developed.  HENT:     Head: Normocephalic.  Eyes:     General: No scleral icterus.    Conjunctiva/sclera: Conjunctivae normal.  Neck:     Musculoskeletal: Normal range of motion.  Cardiovascular:     Rate and Rhythm: Normal rate.  Pulmonary:     Effort: Pulmonary effort is normal.  Abdominal:     Comments: Abdomen soft and nontender  Musculoskeletal: Normal range of motion.  Skin:    General: Skin is warm and dry.  Neurological:     Mental Status: He is alert.     ED Course/Procedures   Clinical Course as of Feb 02 43  Sat Feb 01, 2019  2143 Labs notable for hypercalcemia.  Slight increase in bilirubin.  Patient also has a leukocytosis.  LFTs and lipase are otherwise unremarkable.   [JK]  2149 Patient is still having some nausea.  I will give an additional liter of fluid and Phenergan   [JK]  2215 Plan: Patient continue to have some nausea and vomiting.  Additional medications given.  Will reassess and p.o. trial.  UA and UDS pending.   [HM]  Sun Feb 02, 2019  0038 Tetrahydrocannabinol(!): POSITIVE [HM]    Clinical Course User Index [HM] Jesicca Dipierro, Jarrett [place]oho, [place] [JK] Dorie Rank, MD    Procedures  MDM   Patient presents with nausea and vomiting without significant abdominal pain.  Labs notable for mild leukocytosis and increased total  bili however no focal abdominal pain, normal AST, ALT and lipase.  Less likely to be cholecystitis.  12:39 AM UDS positive for marijuana.  Long discussion with patient.  He reports smoking intermittently however he has become a regular smoker over the last week or so smoking several times per day.  Suspect some element of cannabinoid hyperemesis.  Patient reports he is feeling well at this time.  On exam his abdomen is soft and nontender.  He has been able to tolerate fluids without additional vomiting.  He wishes for discharge home.  Discussed advancing diet slowly and cessation of marijuana usage.  Patient states understanding and is in agreement with the plan.    1. Acute alcoholic gastritis without hemorrhage      Ashe Gago, Gwenlyn Perking 02/02/19 0044    Dorie Rank, MD 02/02/19 [id number]

## 2019-02-02 NOTE — Discharge Instructions (Addendum)
1. Medications: zofran, capsaicin cream, usual home medications 2. Treatment: rest, drink plenty of fluids, advance diet slowly, stop smoking marijuana 3. Follow Up: Please followup with your primary doctor in 1 week for discussion of your diagnoses and further evaluation after today's visit; if you do not have a primary care doctor use the resource guide provided to find one; Please return to the ER for persistent vomiting, high fevers or worsening symptoms

## 2019-04-11 ENCOUNTER — Emergency Department (HOSPITAL_COMMUNITY)
Admission: EM | Admit: 2019-04-11 | Discharge: 2019-04-11 | Disposition: A | Discharge status: Home / Self Care | Payer: Self-pay | Attending: Emergency Medicine | Admitting: Emergency Medicine

## 2019-04-11 ENCOUNTER — Encounter (HOSPITAL_COMMUNITY): Payer: Self-pay | Admitting: Emergency Medicine

## 2019-04-11 DIAGNOSIS — F10120 Alcohol abuse with intoxication, uncomplicated: Secondary | ICD-10-CM | POA: Insufficient documentation

## 2019-04-11 DIAGNOSIS — F17210 Nicotine dependence, cigarettes, uncomplicated: Secondary | ICD-10-CM | POA: Insufficient documentation

## 2019-04-11 LAB — COMPREHENSIVE METABOLIC PANEL
ALT: 33 U/L (ref 0–44)
AST: 31 U/L (ref 15–41)
Albumin: 4.6 g/dL (ref 3.5–5.0)
Alkaline Phosphatase: 53 U/L (ref 38–126)
Anion gap: 17 — ABNORMAL HIGH (ref 5–15)
BUN: 9 mg/dL (ref 6–20)
CO2: 21 mmol/L — ABNORMAL LOW (ref 22–32)
Calcium: 9.9 mg/dL (ref 8.9–10.3)
Chloride: 104 mmol/L (ref 98–111)
Creatinine, Ser: 1.01 mg/dL (ref 0.61–1.24)
GFR calc Af Amer: >60 mL/min (ref >60)
GFR calc non Af Amer: >60 mL/min (ref >60)
Glucose, Bld: 125 mg/dL — ABNORMAL HIGH (ref 70–99)
Potassium: 4.3 mmol/L (ref 3.5–5.1)
Sodium: 142 mmol/L (ref 135–145)
Total Bilirubin: 0.8 mg/dL (ref 0.3–1.2)
Total Protein: 7.6 g/dL (ref 6.5–8.1)

## 2019-04-11 LAB — URINALYSIS, ROUTINE W REFLEX MICROSCOPIC
Bilirubin Urine: NEGATIVE
Glucose, UA: NEGATIVE mg/dL
Hgb urine dipstick: NEGATIVE
Ketones, ur: NEGATIVE mg/dL
Leukocytes,Ua: NEGATIVE
Nitrite: NEGATIVE
Protein, ur: 100 mg/dL — AB
Specific Gravity, Urine: 1.023 (ref 1.005–1.030)
pH: 9 — ABNORMAL HIGH (ref 5.0–8.0)

## 2019-04-11 LAB — CBC
HCT: 47.9 % (ref 39.0–52.0)
Hemoglobin: 16 g/dL (ref 13.0–17.0)
MCH: 30.2 pg (ref 26.0–34.0)
MCHC: 33.4 g/dL (ref 30.0–36.0)
MCV: 90.4 fL (ref 80.0–100.0)
Platelets: 202 10*3/uL (ref 150–400)
RBC: 5.3 MIL/uL (ref 4.22–5.81)
RDW: 12.7 % (ref 11.5–15.5)
WBC: 13.6 10*3/uL — ABNORMAL HIGH (ref 4.0–10.5)
nRBC: 0 % (ref 0.0–0.2)

## 2019-04-11 LAB — LIPASE, BLOOD: Lipase: 18 U/L (ref 11–51)

## 2019-04-11 MED ORDER — SODIUM CHLORIDE 0.9% FLUSH: 3.0000 mL | Freq: Once | INTRAVENOUS | Status: DC

## 2019-04-11 MED ORDER — ONDANSETRON 4 MG PO TBDP
4.0000 mg | ORAL_TABLET | Freq: Once | ORAL | Status: AC | PRN
  Administered 2019-04-11: 4 mg via ORAL
  Filled 2019-04-11: qty 1

## 2019-04-11 MED ORDER — METOCLOPRAMIDE HCL 5 MG/ML IJ SOLN
10.0000 mg | Freq: Once | INTRAMUSCULAR | Status: AC
  Administered 2019-04-11: 10 mg via INTRAVENOUS
  Filled 2019-04-11: qty 2

## 2019-04-11 MED ORDER — DIPHENHYDRAMINE HCL 50 MG/ML IJ SOLN
12.5000 mg | Freq: Once | INTRAMUSCULAR | Status: AC
  Administered 2019-04-11: 12.5 mg via INTRAVENOUS
  Filled 2019-04-11: qty 1

## 2019-04-11 MED ORDER — ONDANSETRON HCL 4 MG/2ML IJ SOLN
4.0000 mg | Freq: Once | INTRAMUSCULAR | Status: AC
  Administered 2019-04-11: 4 mg via INTRAVENOUS
  Filled 2019-04-11: qty 2

## 2019-04-11 MED ORDER — LACTATED RINGERS IV BOLUS
1000.0000 mL | Freq: Once | INTRAVENOUS | Status: AC
  Administered 2019-04-11: 1000 mL via INTRAVENOUS

## 2019-04-11 NOTE — ED Triage Notes (Signed)
Pt reports "I need fluids" 4 episodes of vomiting. Denies fever or diarrhea. No new drug use.

## 2019-04-11 NOTE — ED Notes (Signed)
Pt verbalized understanding of discharge paperwork and follow-up care.  °

## 2019-04-11 NOTE — Discharge Instructions (Signed)
Please continue to stay hydrated. Please follow up as needed with your PCP.

## 2019-04-11 NOTE — ED Provider Notes (Signed)
[place] EMERGENCY DEPARTMENT Provider Note   CSN: [id number] Arrival date & time: 04/11/19  1206     History   Chief Complaint Chief Complaint  Patient presents with  . Emesis  . Nausea    HPI Jason Dodson is a 23 y.o. male.     The history is provided by the patient.  Emesis Severity:  Mild Duration:  1 day Timing:  Constant Number of daily episodes:  5x NBNB Progression:  Partially resolved Chronicity:  New Recent urination:  Normal Context comment:  After increased alcohol intake last night Worsened by:  Food smell Ineffective treatments:  None tried Associated symptoms: no abdominal pain, no chills, no cough, no diarrhea, no fever and no sore throat   Risk factors: alcohol use   Risk factors: no diabetes     Patient presents today after ingesting about a pint of tequila last night.  Patient states he normally only drinks on the weekend, but he does binge drink during these times.  He states that since he woke up, he has had a queasy feeling, nausea, nonbloody nonbilious emesis x5.  He was given Zofran in the lobby that has decreased his queasiness significantly.   History reviewed. No pertinent past medical history.  There are no active problems to display for this patient.   Past Surgical History:  Procedure Laterality Date  . APPENDECTOMY          Home Medications    Prior to Admission medications   Medication Sig Start Date End Date Taking? Authorizing Provider  Capsaicin 0.1 % CREA Apply 1 application topically 4 (four) times daily as needed (abd pain, nausea and vomiting). 02/02/19   Muthersbaugh, Jarrett Soho, PA-C  ibuprofen (ADVIL,MOTRIN) 800 MG tablet Take 1 tablet (800 mg total) by mouth 3 (three) times daily. Patient not taking: Reported on 02/01/2019 03/29/14   Orpah Greek, MD  methocarbamol (ROBAXIN) 500 MG tablet Take 1 tablet (500 mg total) by mouth 2 (two) times daily. Patient not taking: Reported on 02/01/2019  08/15/17   Jacqlyn Larsen, PA-C  ondansetron [facility] ODT) 4 MG disintegrating tablet 4mg  ODT q4 hours prn nausea/vomit 02/02/19   Muthersbaugh, Jarrett Soho, PA-C    Family History No family history on file.  Social History Social History   Tobacco Use  . Smoking status: Current Every Day Smoker    Packs/day: 0.10    Types: Cigarettes  . Smokeless tobacco: Never Used  Substance Use Topics  . Alcohol use: Yes  . Drug use: No    Types: Marijuana    Comment: Prior history but dening today.      Allergies   Patient has no known allergies.   Review of Systems Review of Systems  Constitutional: Negative for chills and fever.  HENT: Negative for sore throat.   Respiratory: Negative for cough.   Gastrointestinal: Positive for nausea and vomiting. Negative for abdominal pain and diarrhea.  Genitourinary: Negative for decreased urine volume.  Skin: Negative for rash and wound.  Neurological: Negative for syncope.  All other systems reviewed and are negative.    Physical Exam Updated Vital Signs BP (!) 148/86   Pulse 63   Temp 99.1 F (37.3 C) (Oral)   Resp 18   SpO2 100%   Physical Exam Vitals signs and nursing note reviewed.  Constitutional:      Appearance: Normal appearance. He is well-developed.     Comments: Well-appearing male, resting in bed.  HENT:     Head:  Normocephalic and atraumatic.  Eyes:     Conjunctiva/sclera: Conjunctivae normal.  Neck:     Musculoskeletal: Neck supple.  Cardiovascular:     Rate and Rhythm: Normal rate and regular rhythm.  Pulmonary:     Effort: Pulmonary effort is normal. No respiratory distress.     Breath sounds: Normal breath sounds. No wheezing.  Abdominal:     General: There is no distension.     Palpations: Abdomen is soft.     Tenderness: There is no abdominal tenderness. There is no guarding or rebound.  Musculoskeletal:     Right lower leg: No edema.     Left lower leg: No edema.  Skin:    General: Skin is warm and dry.   Neurological:     General: No focal deficit present.     Mental Status: He is alert and oriented to person, place, and time.  Psychiatric:        Mood and Affect: Mood normal.        Behavior: Behavior normal.      ED Treatments / Results  Labs (all labs ordered are listed, but only abnormal results are displayed) Labs Reviewed  COMPREHENSIVE METABOLIC PANEL - Abnormal; Notable for the following components:      Result Value   CO2 21 (*)    Glucose, Bld 125 (*)    Anion gap 17 (*)    All other components within normal limits  CBC - Abnormal; Notable for the following components:   WBC 13.6 (*)    All other components within normal limits  URINALYSIS, ROUTINE W REFLEX MICROSCOPIC - Abnormal; Notable for the following components:   APPearance CLOUDY (*)    pH 9.0 (*)    Protein, ur 100 (*)    Bacteria, UA RARE (*)    All other components within normal limits  LIPASE, BLOOD    EKG None  Radiology No results found.  Procedures Procedures (including critical care time)  Medications Ordered in ED Medications  ondansetron (ZOFRAN-ODT) disintegrating tablet 4 mg (4 mg Oral Given 04/11/19 1235)  lactated ringers bolus 1,000 mL (0 mLs Intravenous Stopped 04/11/19 1736)  metoCLOPramide (REGLAN) injection 10 mg (10 mg Intravenous Given 04/11/19 1540)  diphenhydrAMINE (BENADRYL) injection 12.5 mg (12.5 mg Intravenous Given 04/11/19 1540)  ondansetron (ZOFRAN) injection 4 mg (4 mg Intravenous Given 04/11/19 1736)     Initial Impression / Assessment and Plan / ED Course  I have reviewed the triage vital signs and the nursing notes.  Pertinent labs & imaging results that were available during my care of the patient were reviewed by me and considered in my medical decision making (see chart for details).        Jason Dodson is a 23 y.o. male with no significant past medical history presents after drinking a large amount of alcohol last night and feeling nauseous with  emesis this morning.  Labs sent for differential diagnosis of pancreatitis, electrolyte abnormality, infection.  Lipase was normal, anion gap present, glucose was normal. Suspect anion gap related to alcoholic ketosis. Patient is able to tolerate p.o. intake after sufficient antinausea medications were given in the ED.  Although he still feels nauseous, is recommended to hydrate well and follow-up with his PCP as needed as he is very well-appearing.  Stable for discharge at this time.  Counseling given on alcohol intake.  Care of patient discussed with the supervising attending.  Final Clinical Impressions(s) / ED Diagnoses   Final diagnoses:  Hangover  without complication [facility](HCC)    ED Discharge Orders    None       Chester HolsteinVaithi, Slaton Reaser, MD 04/11/19 2253    Maia PlanLong, Joshua G, MD 04/12/19 1204

## 2019-07-09 ENCOUNTER — Other Ambulatory Visit: Payer: Self-pay

## 2019-07-09 ENCOUNTER — Encounter (HOSPITAL_COMMUNITY): Payer: Self-pay

## 2019-07-09 ENCOUNTER — Ambulatory Visit (HOSPITAL_COMMUNITY)
Admission: EM | Admit: 2019-07-09 | Discharge: 2019-07-09 | Disposition: A | Discharge status: Home / Self Care | Payer: Self-pay | Attending: Family Medicine | Admitting: Family Medicine

## 2019-07-09 DIAGNOSIS — Z202 Contact with and (suspected) exposure to infections with a predominantly sexual mode of transmission: Secondary | ICD-10-CM | POA: Insufficient documentation

## 2019-07-09 MED ORDER — CEFTRIAXONE SODIUM 500 MG IJ SOLR: 500.0000 mg | Freq: Once | INTRAMUSCULAR | Status: DC

## 2019-07-09 MED ORDER — DOXYCYCLINE HYCLATE 100 MG PO CAPS: 100.0000 mg | ORAL_CAPSULE | Freq: Two times a day (BID) | ORAL | 0 refills | Status: AC

## 2019-07-09 NOTE — ED Triage Notes (Signed)
Pt presents with complaints of being exposed to chlamydia through sexual partner. States his penis is sore after sexual intercourse along with penile discharge x 1 month.

## 2019-07-09 NOTE — Discharge Instructions (Addendum)

## 2019-07-09 NOTE — ED Provider Notes (Signed)
[facility]   [id number] 07/09/19 Arrival Time: 1445  ASSESSMENT & PLAN:  1. Possible exposure to STD     He now declines empiric Rocephin. Prefers to await results.    Discharge Instructions     You have been given the following today for treatment of suspected gonorrhea and/or chlamydia:  cefTRIAXone (ROCEPHIN) injection 500 mg  Please pick up your prescription for doxycycline 100 mg and begin taking twice daily for the next seven (7) days.  Even though we have treated you today, we have sent testing for sexually transmitted infections. We will notify you of any positive results once they are received. If required, we will prescribe any medications you might need.  Please refrain from all sexual activity for at least the next seven days.     Begin: Meds ordered this encounter  Medications  . doxycycline (VIBRAMYCIN) 100 MG capsule    Sig: Take 1 capsule (100 mg total) by mouth 2 (two) times daily.    Dispense:  14 capsule    Refill:  0  . DISCONTD: cefTRIAXone (ROCEPHIN) injection 500 mg    Pending: Labs Reviewed  CYTOLOGY, (ORAL, ANAL, URETHRAL) ANCILLARY ONLY    Will notify of any positive results. Instructed to refrain from sexual activity for at least seven days. No HIV/RPR testing desired.  Reviewed expectations re: course of current medical issues. Questions answered. Outlined signs and symptoms indicating need for more acute intervention. Patient verbalized understanding. After Visit Summary given.   SUBJECTIVE:  Jason Dodson is a 24 y.o. male who reports possible STD exposure. Partner informed him that she tested positive for what he thinks is chlamydia; not entirely sure. He reports not symptoms including penile discharge. presents with complaint of penile discharge. Denies: urinary frequency, dysuria and gross hematuria. Afebrile. No abdominal or pelvic pain. No n/v. No rashes or lesions. Reports that he is sexually active with single  male partner.   OBJECTIVE:  Vitals:   07/09/19 1537  BP: 106/89  Pulse: 84  Resp: 18  Temp: 98 F (36.7 C)  SpO2: 100%     General appearance: alert, cooperative, appears stated age and no distress Lungs: unlabored respirations Back: no CVA tenderness; FROM at waist Abdomen: soft, non-tender GU: normal appearing genitalia Skin: warm and dry Psychological: alert and cooperative; normal mood and affect.    Labs Reviewed  CYTOLOGY, (ORAL, ANAL, URETHRAL) ANCILLARY ONLY    No Known Allergies  History reviewed. No pertinent past medical history.   Family History  Problem Relation Age of Onset  . Healthy Mother   . Healthy Father    Social History   Socioeconomic History  . Marital status: Single    Spouse name: Not on file  . Number of children: Not on file  . Years of education: Not on file  . Highest education level: Not on file  Occupational History  . Not on file  Tobacco Use  . Smoking status: Current Every Day Smoker    Packs/day: 0.10    Types: Cigarettes  . Smokeless tobacco: Never Used  Substance and Sexual Activity  . Alcohol use: Yes  . Drug use: No    Types: Marijuana    Comment: Prior history but dening today.   Marland Kitchen Sexual activity: Not on file  Other Topics Concern  . Not on file  Social History Narrative  . Not on file   Social Determinants of Health   Financial Resource Strain:   . Difficulty of Paying Living Expenses:  Not on file  Food Insecurity:   . Worried About Charity fundraiser in the Last Year: Not on file  . Ran Out of Food in the Last Year: Not on file  Transportation Needs:   . Lack of Transportation (Medical): Not on file  . Lack of Transportation (Non-Medical): Not on file  Physical Activity:   . Days of Exercise per Week: Not on file  . Minutes of Exercise per Session: Not on file  Stress:   . Feeling of Stress : Not on file  Social Connections:   . Frequency of Communication with Friends and Family: Not on file   . Frequency of Social Gatherings with Friends and Family: Not on file  . Attends Religious Services: Not on file  . Active Member of Clubs or Organizations: Not on file  . Attends Archivist Meetings: Not on file  . Marital Status: Not on file  Intimate Partner Violence:   . Fear of Current or Ex-Partner: Not on file  . Emotionally Abused: Not on file  . Physically Abused: Not on file  . Sexually Abused: Not on file          Vanessa Kick, MD 07/09/19 [phone]

## 2019-07-11 LAB — CYTOLOGY, (ORAL, ANAL, URETHRAL) ANCILLARY ONLY
Chlamydia: NEGATIVE
Neisseria Gonorrhea: NEGATIVE
Trichomonas: NEGATIVE

## 2021-07-24 ENCOUNTER — Ambulatory Visit: Admission: EM | Admit: 2021-07-24 | Discharge: 2021-07-24 | Discharge status: Absent without leave | Payer: Self-pay

## 2021-07-24 ENCOUNTER — Other Ambulatory Visit: Payer: Self-pay

## 2021-07-24 ENCOUNTER — Ambulatory Visit
Admission: EM | Admit: 2021-07-24 | Discharge: 2021-07-24 | Disposition: A | Discharge status: Home / Self Care | Payer: Self-pay | Attending: Internal Medicine | Admitting: Internal Medicine

## 2021-07-24 DIAGNOSIS — R369 Urethral discharge, unspecified: Secondary | ICD-10-CM | POA: Insufficient documentation

## 2021-07-24 DIAGNOSIS — Z113 Encounter for screening for infections with a predominantly sexual mode of transmission: Secondary | ICD-10-CM | POA: Insufficient documentation

## 2021-07-24 MED ORDER — CEFTRIAXONE SODIUM 500 MG IJ SOLR
500.0000 mg | Freq: Once | INTRAMUSCULAR | Status: AC
  Administered 2021-07-24: 500 mg via INTRAMUSCULAR

## 2021-07-24 NOTE — ED Provider Notes (Signed)
[facility]    CSN: [id number] Arrival date & time: 07/24/21  1211      History   Chief Complaint Chief Complaint  Patient presents with   sti sx    HPI Jason Dodson is a 26 y.o. male.   Patient presents with yellow penile discharge that has been present for approximately 3 days.  Denies urinary burning, urinary frequency, testicular pain, abdominal pain, fever.  Denies any known exposure to STD and denies that sexual partner is having similar symptoms.  Patient does not want HIV and syphilis blood work.    History reviewed. No pertinent past medical history.  There are no problems to display for this patient.   Past Surgical History:  Procedure Laterality Date   APPENDECTOMY         Home Medications    Prior to Admission medications   Medication Sig Start Date End Date Taking? Authorizing Provider  Capsaicin 0.1 % CREA Apply 1 application topically 4 (four) times daily as needed (abd pain, nausea and vomiting). 02/02/19   Muthersbaugh, Dahlia Client, PA-C  doxycycline (VIBRAMYCIN) 100 MG capsule Take 1 capsule (100 mg total) by mouth 2 (two) times daily. 07/09/19   Mardella Layman, MD  ibuprofen (ADVIL,MOTRIN) 800 MG tablet Take 1 tablet (800 mg total) by mouth 3 (three) times daily. Patient not taking: Reported on 02/01/2019 03/29/14   Gilda Crease, MD  methocarbamol (ROBAXIN) 500 MG tablet Take 1 tablet (500 mg total) by mouth 2 (two) times daily. Patient not taking: Reported on 02/01/2019 08/15/17   Dartha Lodge, PA-C  ondansetron (ZOFRAN ODT) 4 MG disintegrating tablet 4mg  ODT q4 hours prn nausea/vomit 02/02/19   Muthersbaugh, 04/04/19, PA-C    Family History Family History  Problem Relation Age of Onset   Healthy Mother    Healthy Father     Social History Social History   Tobacco Use   Smoking status: Every Day    Packs/day: 0.10    Types: Cigarettes   Smokeless tobacco: Never  Vaping Use   Vaping Use: Never used  Substance Use Topics    Alcohol use: Yes   Drug use: No    Types: Marijuana    Comment: Prior history but dening today.      Allergies   Patient has no known allergies.   Review of Systems Review of Systems Per HPI  Physical Exam Triage Vital Signs ED Triage Vitals [07/24/21 1223]  Enc Vitals Group     BP 129/82     Pulse Rate 84     Resp 18     Temp 98.2 F (36.8 C)     Temp Source Oral     SpO2 98 %     Weight      Height      Head Circumference      Peak Flow      Pain Score 0     Pain Loc      Pain Edu?      Excl. in GC?    No data found.  Updated Vital Signs BP 129/82 (BP Location: Right Arm)    Pulse 84    Temp 98.2 F (36.8 C) (Oral)    Resp 18    SpO2 98%   Visual Acuity Right Eye Distance:   Left Eye Distance:   Bilateral Distance:    Right Eye Near:   Left Eye Near:    Bilateral Near:     Physical Exam Constitutional:  General: He is not in acute distress.    Appearance: Normal appearance. He is not toxic-appearing or diaphoretic.  HENT:     Head: Normocephalic and atraumatic.  Eyes:     Extraocular Movements: Extraocular movements intact.     Conjunctiva/sclera: Conjunctivae normal.  Pulmonary:     Effort: Pulmonary effort is normal.  Genitourinary:    Comments: Deferred with shared decision making.  Self swab performed. Neurological:     General: No focal deficit present.     Mental Status: He is alert and oriented to person, place, and time. Mental status is at baseline.  Psychiatric:        Mood and Affect: Mood normal.        Behavior: Behavior normal.        Thought Content: Thought content normal.        Judgment: Judgment normal.     UC Treatments / Results  Labs (all labs ordered are listed, but only abnormal results are displayed) Labs Reviewed  CYTOLOGY, (ORAL, ANAL, URETHRAL) ANCILLARY ONLY    EKG   Radiology No results found.  Procedures Procedures (including critical care time)  Medications Ordered in UC Medications   cefTRIAXone (ROCEPHIN) injection 500 mg (has no administration in time range)    Initial Impression / Assessment and Plan / UC Course  I have reviewed the triage vital signs and the nursing notes.  Pertinent labs & imaging results that were available during my care of the patient were reviewed by me and considered in my medical decision making (see chart for details).     Patient originally stated that he was not exposed to a confirmed STD and sexual partner did not have any similar symptoms.  Although, when patient was advised that we would have to wait on cytology swab before treatment, patient stated that he had been exposed to gonorrhea.  Patient is a poor historian and unable to confirm if he was exposed to an STD or not.  Will opt to treat with Rocephin IM today in urgent care.  Will await results for further treatment if needed given inconsistencies in patient's history.  Cytology swab pending.  Do not think that urinalysis is necessary.  Patient to refrain from sexual activity until test results and treatment are complete.  Patient verbalized understanding and was agreeable with plan. Final Clinical Impressions(s) / UC Diagnoses   Final diagnoses:  Penile discharge  Screening examination for venereal disease     Discharge Instructions      You were given an antibiotic injection in urgent care today.  Your penile swab is pending.  We will call if it is positive.  Refrain from sexual activity at this time.    ED Prescriptions   None    PDMP not reviewed this encounter.   Gustavus Bryant, [place] 07/24/21 1244

## 2021-07-24 NOTE — Discharge Instructions (Addendum)
You were given an antibiotic injection in urgent care today.  Your penile swab is pending.  We will call if it is positive.  Refrain from sexual activity at this time.

## 2021-07-24 NOTE — ED Triage Notes (Signed)
Pt c/o exposure to gonorrhea. Associated sx yellow dc, dysuria, polyuria.   Onset ~ 3 days ago

## 2021-07-26 ENCOUNTER — Telehealth (HOSPITAL_COMMUNITY): Payer: Self-pay | Admitting: Emergency Medicine

## 2021-07-26 LAB — CYTOLOGY, (ORAL, ANAL, URETHRAL) ANCILLARY ONLY
Chlamydia: NEGATIVE
Comment: NEGATIVE
Comment: NEGATIVE
Comment: NORMAL
Neisseria Gonorrhea: POSITIVE — AB
Trichomonas: POSITIVE — AB

## 2021-07-26 MED ORDER — METRONIDAZOLE 500 MG PO TABS: 2000.0000 mg | ORAL_TABLET | Freq: Once | ORAL | 0 refills | Status: AC

## 2022-10-18 ENCOUNTER — Emergency Department (HOSPITAL_COMMUNITY): Payer: Medicaid Other

## 2022-10-18 ENCOUNTER — Other Ambulatory Visit: Payer: Self-pay

## 2022-10-18 ENCOUNTER — Observation Stay (HOSPITAL_COMMUNITY)
Admission: EM | Admit: 2022-10-18 | Discharge: 2022-10-19 | Disposition: A | Discharge status: Home / Self Care | Payer: Medicaid Other | Attending: Emergency Medicine | Admitting: Emergency Medicine

## 2022-10-18 ENCOUNTER — Encounter (HOSPITAL_COMMUNITY): Payer: Self-pay

## 2022-10-18 DIAGNOSIS — W3400XA Accidental discharge from unspecified firearms or gun, initial encounter: Secondary | ICD-10-CM | POA: Diagnosis not present

## 2022-10-18 DIAGNOSIS — S81832A Puncture wound without foreign body, left lower leg, initial encounter: Secondary | ICD-10-CM | POA: Diagnosis present

## 2022-10-18 DIAGNOSIS — Y249XXA Unspecified firearm discharge, undetermined intent, initial encounter: Secondary | ICD-10-CM

## 2022-10-18 DIAGNOSIS — Z23 Encounter for immunization: Secondary | ICD-10-CM | POA: Insufficient documentation

## 2022-10-18 DIAGNOSIS — I7389 Other specified peripheral vascular diseases: Secondary | ICD-10-CM | POA: Diagnosis present

## 2022-10-18 DIAGNOSIS — I959 Hypotension, unspecified: Secondary | ICD-10-CM | POA: Diagnosis present

## 2022-10-18 DIAGNOSIS — W320XXA Accidental handgun discharge, initial encounter: Secondary | ICD-10-CM

## 2022-10-18 LAB — CBC WITH DIFFERENTIAL/PLATELET
Abs Immature Granulocytes: 0.09 10*3/uL — ABNORMAL HIGH (ref 0.00–0.07)
Basophils Absolute: 0.1 10*3/uL (ref 0.0–0.1)
Basophils Relative: 1 %
Eosinophils Absolute: 0.1 10*3/uL (ref 0.0–0.5)
Eosinophils Relative: 1 %
HCT: 41 % (ref 39.0–52.0)
Hemoglobin: 13.3 g/dL (ref 13.0–17.0)
Immature Granulocytes: 1 %
Lymphocytes Relative: 54 %
Lymphs Abs: 5.5 10*3/uL — ABNORMAL HIGH (ref 0.7–4.0)
MCH: 29.9 pg (ref 26.0–34.0)
MCHC: 32.4 g/dL (ref 30.0–36.0)
MCV: 92.1 fL (ref 80.0–100.0)
Monocytes Absolute: 0.7 10*3/uL (ref 0.1–1.0)
Monocytes Relative: 7 %
Neutro Abs: 3.7 10*3/uL (ref 1.7–7.7)
Neutrophils Relative %: 36 %
Platelets: 190 10*3/uL (ref 150–400)
RBC: 4.45 MIL/uL (ref 4.22–5.81)
RDW: 12.6 % (ref 11.5–15.5)
WBC: 10.1 10*3/uL (ref 4.0–10.5)
nRBC: 0 % (ref 0.0–0.2)

## 2022-10-18 LAB — PROTIME-INR
INR: 1 (ref 0.8–1.2)
Prothrombin Time: 13.4 seconds (ref 11.4–15.2)

## 2022-10-18 MED ORDER — IOHEXOL 350 MG/ML SOLN
100.0000 mL | Freq: Once | INTRAVENOUS | Status: AC | PRN
  Administered 2022-10-18: 100 mL via INTRAVENOUS

## 2022-10-18 MED ORDER — CEFAZOLIN SODIUM-DEXTROSE 1-4 GM/50ML-% IV SOLN
1.0000 g | Freq: Once | INTRAVENOUS | Status: AC
  Administered 2022-10-18: 1 g via INTRAVENOUS
  Filled 2022-10-18: qty 50

## 2022-10-18 MED ORDER — SODIUM CHLORIDE 0.9% IV SOLUTION: Freq: Once | INTRAVENOUS | Status: AC

## 2022-10-18 MED ORDER — SODIUM CHLORIDE 0.9% IV SOLUTION: Freq: Once | INTRAVENOUS | Status: DC

## 2022-10-18 MED ORDER — SODIUM CHLORIDE 0.9 % IV BOLUS
1000.0000 mL | Freq: Once | INTRAVENOUS | Status: AC
  Administered 2022-10-18: 1000 mL via INTRAVENOUS

## 2022-10-18 MED ORDER — TETANUS-DIPHTH-ACELL PERTUSSIS 5-2.5-18.5 LF-MCG/0.5 IM SUSY
0.5000 mL | PREFILLED_SYRINGE | Freq: Once | INTRAMUSCULAR | Status: AC
  Administered 2022-10-19: 0.5 mL via INTRAMUSCULAR
  Filled 2022-10-18: qty 0.5
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
## 2022-10-19 ENCOUNTER — Other Ambulatory Visit (HOSPITAL_COMMUNITY): Payer: Self-pay

## 2022-10-19 ENCOUNTER — Inpatient Hospital Stay (HOSPITAL_COMMUNITY): Payer: Medicaid Other

## 2022-10-19 DIAGNOSIS — S81832A Puncture wound without foreign body, left lower leg, initial encounter: Secondary | ICD-10-CM

## 2022-10-19 DIAGNOSIS — S81839A Puncture wound without foreign body, unspecified lower leg, initial encounter: Secondary | ICD-10-CM

## 2022-10-19 DIAGNOSIS — W3400XA Accidental discharge from unspecified firearms or gun, initial encounter: Principal | ICD-10-CM

## 2022-10-19 DIAGNOSIS — Z23 Encounter for immunization: Secondary | ICD-10-CM | POA: Diagnosis not present

## 2022-10-19 LAB — COMPREHENSIVE METABOLIC PANEL
ALT: 20 U/L (ref 0–44)
AST: 26 U/L (ref 15–41)
Albumin: 3.7 g/dL (ref 3.5–5.0)
Alkaline Phosphatase: 45 U/L (ref 38–126)
Anion gap: 12 (ref 5–15)
BUN: 16 mg/dL (ref 6–20)
CO2: 23 mmol/L (ref 22–32)
Calcium: 8.6 mg/dL — ABNORMAL LOW (ref 8.9–10.3)
Chloride: 104 mmol/L (ref 98–111)
Creatinine, Ser: 1.31 mg/dL — ABNORMAL HIGH (ref 0.61–1.24)
GFR, Estimated: >60 mL/min (ref >60)
Glucose, Bld: 110 mg/dL — ABNORMAL HIGH (ref 70–99)
Potassium: 2.8 mmol/L — ABNORMAL LOW (ref 3.5–5.1)
Sodium: 139 mmol/L (ref 135–145)
Total Bilirubin: 0.4 mg/dL (ref 0.3–1.2)
Total Protein: 6.5 g/dL (ref 6.5–8.1)

## 2022-10-19 LAB — CBC
HCT: 36.4 % — ABNORMAL LOW (ref 39.0–52.0)
Hemoglobin: 12.2 g/dL — ABNORMAL LOW (ref 13.0–17.0)
MCH: 30.5 pg (ref 26.0–34.0)
MCHC: 33.5 g/dL (ref 30.0–36.0)
MCV: 91 fL (ref 80.0–100.0)
Platelets: 155 10*3/uL (ref 150–400)
RBC: 4 MIL/uL — ABNORMAL LOW (ref 4.22–5.81)
RDW: 12.7 % (ref 11.5–15.5)
WBC: 21 10*3/uL — ABNORMAL HIGH (ref 4.0–10.5)
nRBC: 0 % (ref 0.0–0.2)

## 2022-10-19 LAB — HIV ANTIBODY (ROUTINE TESTING W REFLEX): HIV Screen 4th Generation wRfx: NONREACTIVE

## 2022-10-19 LAB — BASIC METABOLIC PANEL
Anion gap: 7 (ref 5–15)
BUN: 12 mg/dL (ref 6–20)
CO2: 20 mmol/L — ABNORMAL LOW (ref 22–32)
Calcium: 8.2 mg/dL — ABNORMAL LOW (ref 8.9–10.3)
Chloride: 112 mmol/L — ABNORMAL HIGH (ref 98–111)
Creatinine, Ser: 1.02 mg/dL (ref 0.61–1.24)
GFR, Estimated: >60 mL/min (ref >60)
Glucose, Bld: 127 mg/dL — ABNORMAL HIGH (ref 70–99)
Potassium: 4 mmol/L (ref 3.5–5.1)
Sodium: 139 mmol/L (ref 135–145)

## 2022-10-19 LAB — BPAM RBC
Blood Product Expiration Date: 202405302359
Unit Type and Rh: 6200

## 2022-10-19 LAB — TYPE AND SCREEN
ABO/RH(D): A POS
ABO/RH(D): A POS
Antibody Screen: NEGATIVE
Antibody Screen: NEGATIVE
Unit division: 0
Unit division: 0

## 2022-10-19 LAB — PREPARE RBC (CROSSMATCH)

## 2022-10-19 MED ORDER — OXYCODONE HCL 5 MG PO TABS
5.0000 mg | ORAL_TABLET | Freq: Four times a day (QID) | ORAL | 0 refills | Status: AC | PRN
  Filled 2022-10-19: qty 10, 3d supply, fill #0

## 2022-10-19 MED ORDER — FENTANYL CITRATE PF 50 MCG/ML IJ SOSY
50.0000 ug | PREFILLED_SYRINGE | Freq: Once | INTRAMUSCULAR | Status: AC
  Administered 2022-10-19: 50 ug via INTRAVENOUS
  Filled 2022-10-19: qty 1

## 2022-10-19 MED ORDER — SIMETHICONE 40 MG/0.6ML PO SUSP: 80.0000 mg | Freq: Four times a day (QID) | ORAL | Status: DC | PRN

## 2022-10-19 MED ORDER — ACETAMINOPHEN 500 MG PO TABS
1000.0000 mg | ORAL_TABLET | Freq: Four times a day (QID) | ORAL | Status: DC
  Filled 2022-10-19: qty 2

## 2022-10-19 MED ORDER — ONDANSETRON 4 MG PO TBDP: 4.0000 mg | ORAL_TABLET | Freq: Four times a day (QID) | ORAL | Status: DC | PRN

## 2022-10-19 MED ORDER — IBUPROFEN 200 MG PO TABS
600.0000 mg | ORAL_TABLET | Freq: Three times a day (TID) | ORAL | Status: DC
  Administered 2022-10-19 (×2): 600 mg via ORAL
  Filled 2022-10-19 (×2): qty 3

## 2022-10-19 MED ORDER — HYDRALAZINE HCL 20 MG/ML IJ SOLN: 10.0000 mg | INTRAMUSCULAR | Status: DC | PRN

## 2022-10-19 MED ORDER — NAPHAZOLINE-GLYCERIN 0.012-0.25 % OP SOLN: 1.0000 [drp] | Freq: Four times a day (QID) | OPHTHALMIC | Status: DC | PRN

## 2022-10-19 MED ORDER — METHOCARBAMOL 500 MG PO TABS
500.0000 mg | ORAL_TABLET | Freq: Three times a day (TID) | ORAL | 0 refills | Status: AC | PRN
  Filled 2022-10-19: qty 30, 10d supply, fill #0

## 2022-10-19 MED ORDER — HYDROMORPHONE HCL 1 MG/ML IJ SOLN
0.5000 mg | INTRAMUSCULAR | Status: DC | PRN
  Administered 2022-10-19: 0.5 mg via INTRAVENOUS

## 2022-10-19 MED ORDER — LIP MEDEX EX OINT: TOPICAL_OINTMENT | Freq: Two times a day (BID) | CUTANEOUS | Status: DC

## 2022-10-19 MED ORDER — GABAPENTIN 100 MG PO CAPS
100.0000 mg | ORAL_CAPSULE | Freq: Two times a day (BID) | ORAL | 0 refills | Status: AC | PRN
  Filled 2022-10-19: qty 20, 10d supply, fill #0

## 2022-10-19 MED ORDER — ACETAMINOPHEN 500 MG PO TABS: 1000.0000 mg | ORAL_TABLET | Freq: Four times a day (QID) | ORAL | 0 refills | Status: AC

## 2022-10-19 MED ORDER — LACTATED RINGERS IV BOLUS
1000.0000 mL | Freq: Once | INTRAVENOUS | Status: AC
  Administered 2022-10-19: 1000 mL via INTRAVENOUS

## 2022-10-19 MED ORDER — OXYCODONE HCL 5 MG PO TABS: 5.0000 mg | ORAL_TABLET | ORAL | Status: DC | PRN

## 2022-10-19 MED ORDER — ONDANSETRON HCL 4 MG/2ML IJ SOLN: 4.0000 mg | Freq: Four times a day (QID) | INTRAMUSCULAR | Status: DC | PRN

## 2022-10-19 MED ORDER — OXYCODONE HCL 5 MG PO TABS
5.0000 mg | ORAL_TABLET | Freq: Four times a day (QID) | ORAL | Status: DC | PRN
  Administered 2022-10-19: 5 mg via ORAL
  Filled 2022-10-19: qty 1

## 2022-10-19 MED ORDER — DOCUSATE SODIUM 100 MG PO CAPS
100.0000 mg | ORAL_CAPSULE | Freq: Two times a day (BID) | ORAL | Status: DC
  Administered 2022-10-19: 100 mg via ORAL
  Filled 2022-10-19: qty 1

## 2022-10-19 MED ORDER — POLYETHYLENE GLYCOL 3350 17 G PO PACK: 17.0000 g | PACK | Freq: Every day | ORAL | Status: DC

## 2022-10-19 MED ORDER — MAGIC MOUTHWASH: 15.0000 mL | Freq: Four times a day (QID) | ORAL | Status: DC | PRN

## 2022-10-19 MED ORDER — BISACODYL 10 MG RE SUPP: 10.0000 mg | Freq: Two times a day (BID) | RECTAL | Status: DC | PRN

## 2022-10-19 MED ORDER — FENTANYL CITRATE PF 50 MCG/ML IJ SOSY
50.0000 ug | PREFILLED_SYRINGE | INTRAMUSCULAR | Status: DC | PRN
  Administered 2022-10-19: 50 ug via INTRAVENOUS
  Filled 2022-10-19: qty 1

## 2022-10-19 MED ORDER — ALUM & MAG HYDROXIDE-SIMETH 200-200-20 MG/5ML PO SUSP: 30.0000 mL | Freq: Four times a day (QID) | ORAL | Status: DC | PRN

## 2022-10-19 MED ORDER — TRAMADOL HCL 50 MG PO TABS
50.0000 mg | ORAL_TABLET | Freq: Four times a day (QID) | ORAL | Status: DC | PRN
  Administered 2022-10-19: 50 mg via ORAL
  Filled 2022-10-19: qty 1

## 2022-10-19 MED ORDER — MORPHINE SULFATE (PF) 4 MG/ML IV SOLN
4.0000 mg | Freq: Once | INTRAVENOUS | Status: AC
  Administered 2022-10-19: 4 mg via INTRAVENOUS
  Filled 2022-10-19: qty 1

## 2022-10-19 MED ORDER — FENTANYL CITRATE PF 50 MCG/ML IJ SOSY: 50.0000 ug | PREFILLED_SYRINGE | Freq: Once | INTRAMUSCULAR | Status: DC

## 2022-10-19 MED ORDER — ASPIRIN 81 MG PO TBEC
81.0000 mg | DELAYED_RELEASE_TABLET | Freq: Every day | ORAL | Status: DC
  Administered 2022-10-19: 81 mg via ORAL
  Filled 2022-10-19: qty 1

## 2022-10-19 MED ORDER — ENOXAPARIN SODIUM 30 MG/0.3ML IJ SOSY: 30.0000 mg | PREFILLED_SYRINGE | Freq: Two times a day (BID) | INTRAMUSCULAR | Status: DC

## 2022-10-19 MED ORDER — ONDANSETRON HCL 4 MG/2ML IJ SOLN
4.0000 mg | Freq: Once | INTRAMUSCULAR | Status: AC
  Administered 2022-10-19: 4 mg via INTRAVENOUS
  Filled 2022-10-19: qty 2

## 2022-10-19 MED ORDER — PHENOL 1.4 % MT LIQD: 2.0000 | OROMUCOSAL | Status: DC | PRN

## 2022-10-19 MED ORDER — ACETAMINOPHEN 500 MG PO TABS
1000.0000 mg | ORAL_TABLET | Freq: Four times a day (QID) | ORAL | Status: DC
  Administered 2022-10-19 (×2): 1000 mg via ORAL
  Filled 2022-10-19 (×2): qty 2

## 2022-10-19 MED ORDER — IBUPROFEN 200 MG PO TABS: 600.0000 mg | ORAL_TABLET | Freq: Four times a day (QID) | ORAL | Status: DC | PRN

## 2022-10-19 MED ORDER — SODIUM CHLORIDE 0.9 % IV SOLN: 8.0000 mg | Freq: Four times a day (QID) | INTRAVENOUS | Status: DC | PRN

## 2022-10-19 MED ORDER — GABAPENTIN 100 MG PO CAPS
100.0000 mg | ORAL_CAPSULE | Freq: Two times a day (BID) | ORAL | Status: DC
  Administered 2022-10-19: 100 mg via ORAL
  Filled 2022-10-19: qty 1

## 2022-10-19 MED ORDER — MENTHOL 3 MG MT LOZG: 1.0000 | LOZENGE | OROMUCOSAL | Status: DC | PRN

## 2022-10-19 MED ORDER — SALINE SPRAY 0.65 % NA SOLN: 1.0000 | Freq: Four times a day (QID) | NASAL | Status: DC | PRN

## 2022-10-19 MED ORDER — METHOCARBAMOL 500 MG PO TABS
500.0000 mg | ORAL_TABLET | Freq: Three times a day (TID) | ORAL | Status: DC
  Administered 2022-10-19 (×2): 500 mg via ORAL
  Filled 2022-10-19 (×2): qty 1

## 2022-10-19 MED ORDER — LACTATED RINGERS IV BOLUS: 1000.0000 mL | Freq: Three times a day (TID) | INTRAVENOUS | Status: DC | PRN

## 2022-10-19 MED ORDER — CEFAZOLIN SODIUM-DEXTROSE 2-4 GM/100ML-% IV SOLN: 2.0000 g | Freq: Three times a day (TID) | INTRAVENOUS | Status: DC

## 2022-10-19 MED ORDER — METOPROLOL TARTRATE 5 MG/5ML IV SOLN: 5.0000 mg | Freq: Four times a day (QID) | INTRAVENOUS | Status: DC | PRN

## 2022-10-19 MED ORDER — FENTANYL CITRATE PF 50 MCG/ML IJ SOSY: 25.0000 ug | PREFILLED_SYRINGE | INTRAMUSCULAR | Status: DC | PRN

## 2022-10-19 MED ORDER — POLYETHYLENE GLYCOL 3350 17 G PO PACK: 17.0000 g | PACK | Freq: Two times a day (BID) | ORAL | Status: DC | PRN

## 2022-10-19 MED ORDER — METHOCARBAMOL 1000 MG/10ML IJ SOLN: 1000.0000 mg | Freq: Four times a day (QID) | INTRAVENOUS | Status: DC | PRN

## 2022-10-19 MED ORDER — HYDROMORPHONE HCL 1 MG/ML IJ SOLN
0.5000 mg | INTRAMUSCULAR | Status: DC | PRN
  Administered 2022-10-19: 0.5 mg via INTRAVENOUS
  Filled 2022-10-19 (×2): qty 0.5

## 2022-10-19 MED ORDER — DIPHENHYDRAMINE HCL 50 MG/ML IJ SOLN: 12.5000 mg | Freq: Four times a day (QID) | INTRAMUSCULAR | Status: DC | PRN

## 2022-10-19 MED ORDER — POLYETHYLENE GLYCOL 3350 17 G PO PACK: 17.0000 g | PACK | Freq: Every day | ORAL | Status: DC | PRN

## 2022-10-19 MED ORDER — IBUPROFEN 600 MG PO TABS: 600.0000 mg | ORAL_TABLET | Freq: Three times a day (TID) | ORAL | 0 refills | Status: AC | PRN

## 2022-10-19 MED ORDER — METHOCARBAMOL 500 MG PO TABS: 1000.0000 mg | ORAL_TABLET | Freq: Four times a day (QID) | ORAL | Status: DC | PRN

## 2022-10-19 MED ORDER — METHOCARBAMOL 1000 MG/10ML IJ SOLN
500.0000 mg | Freq: Three times a day (TID) | INTRAVENOUS | Status: DC
  Filled 2022-10-19: qty 5

## 2022-10-19 NOTE — Discharge Instructions (Signed)
You may walk on your leg with no restrictions as you are able.
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
## 2022-10-19 NOTE — ED Notes (Signed)
[facility] called for transport. 

## 2022-10-19 NOTE — Progress Notes (Signed)
Orthopedic Tech Progress Note Patient Details:  Jason Dodson [birth year] [id number]  Ortho Devices Type of Ortho Device: Crutches Ortho Device/Splint Interventions: Ordered, Adjustment   Post Interventions Patient Tolerated: Well Instructions Provided: Adjustment of device  [name] [name] 10/19/2022, 3:07 PM

## 2022-10-19 NOTE — ED Triage Notes (Signed)
Patient transferred from [facility] long  long for vascular eval.

## 2022-10-19 NOTE — Progress Notes (Signed)
Seen this morning.  Pain limits motor exam - appears to have no deficits. Sensation intact to foot Palpable PT and DP.  Victorino Sparrow MD
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
## 2022-10-19 NOTE — TOC CAGE-AID Note (Signed)
Transition of Care [facility]) - CAGE-AID Screening   Patient Details  Name: Eliakim Puzon MRN: [id number] Date of Birth: May 24, 1996  [facility] of [facility] CM/SW Contact:    Leota Sauers, RN Phone Number: 10/19/2022, 6:52 AM   Clinical Narrative:  Patient denies use of alcohol or drugs. Education not offered at this time.  CAGE-AID Screening:    Have You Ever Felt You Ought to Cut Down on Your Drinking or Drug Use?: No Have People Annoyed You By Critizing Your Drinking Or Drug Use?: No Have You Felt Bad Or Guilty About Your Drinking Or Drug Use?: No Have You Ever Had a Drink or Used Drugs First Thing In The Morning to Steady Your Nerves or to Get Rid of a Hangover?: No CAGE-AID Score: 0  Substance Abuse Education Offered: No
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
## 2022-10-19 NOTE — Progress Notes (Signed)
Left lower extremity duplex arterial ultrasound reviewed Low flow in the posterior tibial, anterior tibial artery.  The anterior tibial artery appears to demonstrate some flow reversal.  The main artery supplying blood flow to the foot is the peroneal artery which demonstrates normal waveforms.  There is a large collateral feeding the posterior tibial artery at the level of the ankle.  This is why the PT is palpable.  The patient does not have any signs of limb threatening ischemia.  He has had no bleeding episodes. Patient with a palpable posterior tibial pulse at the level of the ankle.  No plan for urgent revascularization is he does not have acute limb ischemia.  I have called my office and schedule follow-up-1 month with ABI.   Victorino Sparrow MD
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
## 2022-10-19 NOTE — ED Notes (Signed)
Report called to Tresa Endo (CN) at [facility] for ED to ED transfer.

## 2022-10-21 LAB — TYPE AND SCREEN
ABO/RH(D): A POS
Antibody Screen: NEGATIVE

## 2022-10-21 LAB — BPAM RBC
Blood Product Expiration Date: 202405302359
Unit Type and Rh: 6200

## 2022-10-24 ENCOUNTER — Telehealth: Payer: Self-pay | Admitting: Vascular Surgery

## 2022-10-24 NOTE — Telephone Encounter (Signed)
Appt has been scheduled.
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
## 2022-11-02 ENCOUNTER — Encounter (INDEPENDENT_AMBULATORY_CARE_PROVIDER_SITE_OTHER): Payer: Self-pay

## 2022-11-02 ENCOUNTER — Encounter (INDEPENDENT_AMBULATORY_CARE_PROVIDER_SITE_OTHER): Payer: Self-pay | Admitting: Primary Care

## 2022-11-02 NOTE — Progress Notes (Signed)
Patient came in for hosp f/u  requesting oxi was told provider did not prescribe narcotic pt said no need waste my time or money

## 2022-11-14 ENCOUNTER — Other Ambulatory Visit: Payer: Self-pay | Admitting: *Deleted

## 2022-11-14 ENCOUNTER — Ambulatory Visit: Payer: Medicaid Other | Attending: General Surgery | Admitting: Physical Therapy

## 2022-11-14 DIAGNOSIS — M79605 Pain in left leg: Secondary | ICD-10-CM
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
## 2022-11-24 ENCOUNTER — Ambulatory Visit (HOSPITAL_COMMUNITY): Admission: RE | Admit: 2022-11-24 | Payer: Medicaid Other | Source: Ambulatory Visit

## 2022-11-24 ENCOUNTER — Ambulatory Visit: Payer: Medicaid Other | Admitting: Vascular Surgery

## 2022-12-11 ENCOUNTER — Ambulatory Visit: Payer: Medicaid Other | Attending: General Surgery | Admitting: Physical Therapy

## 2022-12-13 ENCOUNTER — Telehealth: Payer: Self-pay | Admitting: Vascular Surgery

## 2022-12-13 NOTE — Telephone Encounter (Signed)
Pt missed appt. Lvm to r/s

## 2023-01-05 ENCOUNTER — Ambulatory Visit: Payer: Medicaid Other | Attending: General Surgery | Admitting: Physical Therapy

## 2023-05-14 ENCOUNTER — Emergency Department (HOSPITAL_COMMUNITY)
Admission: EM | Admit: 2023-05-14 | Discharge: 2023-05-14 | Disposition: A | Discharge status: Home / Self Care | Payer: Medicaid Other | Attending: Emergency Medicine | Admitting: Emergency Medicine

## 2023-05-14 ENCOUNTER — Encounter (HOSPITAL_COMMUNITY): Payer: Self-pay

## 2023-05-14 DIAGNOSIS — K0889 Other specified disorders of teeth and supporting structures: Secondary | ICD-10-CM | POA: Insufficient documentation

## 2023-05-14 MED ORDER — PENICILLIN V POTASSIUM 500 MG PO TABS: 500.0000 mg | ORAL_TABLET | Freq: Four times a day (QID) | ORAL | 0 refills | Status: AC

## 2023-05-14 NOTE — ED Provider Notes (Signed)
[place] EMERGENCY DEPARTMENT AT [facility] Provider Note   CSN: [id number] Arrival date & time: 05/14/23  1735     History  Chief Complaint  Patient presents with   Dental Pain    Jason Dodson is a 27 y.o. male.  Patient with no pertinent past medical history presents today with complaints of dental pain. States that same began 2 weeks ago when he chipped his left lower molar and has been persistent since. Notes pain to the left lower molar. Called a dentist today to schedule an appointment but has not seen them yet. Denies fevers or chills. Able to swallow without issue.   The history is provided by the patient. No language interpreter was used.  Dental Pain      Home Medications Prior to Admission medications   Medication Sig Start Date End Date Taking? Authorizing Provider  acetaminophen (TYLENOL) 500 MG tablet Take 2 tablets (1,000 mg total) by mouth every 6 (six) hours. 10/19/22   Barnetta Chapel, PA-C  Capsaicin 0.1 % CREA Apply 1 application topically 4 (four) times daily as needed (abd pain, nausea and vomiting). 02/02/19   Muthersbaugh, Dahlia Client, PA-C  doxycycline (VIBRAMYCIN) 100 MG capsule Take 1 capsule (100 mg total) by mouth 2 (two) times daily. 07/09/19   Mardella Layman, MD  gabapentin (NEURONTIN) 100 MG capsule Take 1 capsule (100 mg total) by mouth 2 (two) times daily as needed. 10/19/22   Barnetta Chapel, PA-C  ibuprofen (ADVIL) 600 MG tablet Take 1 tablet (600 mg total) by mouth every 8 (eight) hours as needed. 10/19/22   Barnetta Chapel, PA-C  ibuprofen (ADVIL,MOTRIN) 800 MG tablet Take 1 tablet (800 mg total) by mouth 3 (three) times daily. Patient not taking: Reported on 02/01/2019 03/29/14   Gilda Crease, MD  methocarbamol (ROBAXIN) 500 MG tablet Take 1 tablet (500 mg total) by mouth 2 (two) times daily. Patient not taking: Reported on 02/01/2019 08/15/17   Rosezella Rumpf, PA-C  methocarbamol (ROBAXIN) 500 MG tablet Take 1 tablet  (500 mg total) by mouth every 8 (eight) hours as needed for muscle spasms. 10/19/22   Barnetta Chapel, PA-C  ondansetron (ZOFRAN ODT) 4 MG disintegrating tablet 4mg  ODT q4 hours prn nausea/vomit 02/02/19   Muthersbaugh, Dahlia Client, PA-C  oxyCODONE (OXY IR/ROXICODONE) 5 MG immediate release tablet Take 1 tablet (5 mg total) by mouth every 6 (six) hours as needed for moderate pain. 10/19/22   Barnetta Chapel, PA-C      Allergies    Patient has no known allergies.    Review of Systems   Review of Systems  HENT:  Positive for dental problem.   All other systems reviewed and are negative.   Physical Exam Updated Vital Signs BP 119/87   Pulse 78   Temp 97.9 F (36.6 C)   Resp 17   Ht 6' (1.829 m)   Wt 104.3 kg   SpO2 100%   BMI 31.19 kg/m  Physical Exam Vitals and nursing note reviewed.  Constitutional:      General: He is not in acute distress.    Appearance: Normal appearance. He is normal weight. He is not ill-appearing, toxic-appearing or diaphoretic.  HENT:     Head: Normocephalic and atraumatic.     Mouth/Throat:      Comments: Dentition poor throughout with cracked left lower molar. No swelling under the tongue or signs of Ludwigs angina. Trace left sided facial swelling without gross abscess. No trismus or crepitus. Tolerating secretions without  issue. Neck:     Comments: No meningismus Cardiovascular:     Rate and Rhythm: Normal rate.  Pulmonary:     Effort: Pulmonary effort is normal. No respiratory distress.  Musculoskeletal:        General: Normal range of motion.     Cervical back: Normal range of motion and neck supple.  Skin:    General: Skin is warm and dry.  Neurological:     General: No focal deficit present.     Mental Status: He is alert.  Psychiatric:        Mood and Affect: Mood normal.        Behavior: Behavior normal.     ED Results / Procedures / Treatments   Labs (all labs ordered are listed, but only abnormal results are displayed) Labs Reviewed -  No data to display  EKG None  Radiology No results found.  Procedures Procedures    Medications Ordered in ED Medications - No data to display  ED Course/ Medical Decision Making/ A&P                                 Medical Decision Making  Patient with dental pain x 2 weeks. He is afebrile, non-toxic appearing, and in no acute distress with reassuring vital signs. Physical exam reveals cracked left lower molar with poor dentition throughout. Trace facial swelling with no gross abscess.  Exam unconcerning for Ludwig's angina or spread of infection.  Will treat with penicillin and anti-inflammatories medicine.  Urged patient to follow-up with dentist.  Resources given for same. Evaluation and diagnostic testing in the emergency department does not suggest an emergent condition requiring admission or immediate intervention beyond what has been performed at this time.  Plan for discharge with close PCP follow-up.  Patient is understanding and amenable with plan, educated on red flag symptoms that would prompt immediate return.  Patient discharged in stable condition.  Final Clinical Impression(s) / ED Diagnoses Final diagnoses:  Pain, dental    Rx / DC Orders ED Discharge Orders          Ordered    penicillin v potassium (VEETID) 500 MG tablet  4 times daily        05/14/23 1846          An After Visit Summary was printed and given to the patient.     Silva Bandy, PA-C 05/14/23 1846    Loetta Rough, MD 05/14/23 2040

## 2023-05-14 NOTE — ED Triage Notes (Signed)
Pt arrives via POV. Pt reports left lower dental pain x 2 weeks. Pt AxOx4.

## 2023-05-14 NOTE — Discharge Instructions (Addendum)
You were seen in the emergency department for dental pain.  As we discussed I think your pain is likely related to a cavity. We normally treat this with anti-inflammatories and antibiotics.   I've attached a resource guide with several dentists in the area. It's incredibly important you follow up with a dentist as soon as possible for definitive treatment.   Continue to monitor how you're doing and return to the ER for new or worsening symptoms such as difficulty swallowing your own saliva, difficulty breathing, or fever.
